# Patient Record
Sex: Female | Born: 1960
Health system: Southern US, Community
[De-identification: ages and names within clinical notes are randomized; demographics above are authoritative.]

## PROBLEM LIST (undated history)

## (undated) DIAGNOSIS — R7303 Prediabetes: Secondary | ICD-10-CM

## (undated) DIAGNOSIS — K219 Gastro-esophageal reflux disease without esophagitis: Secondary | ICD-10-CM

## (undated) DIAGNOSIS — G4730 Sleep apnea, unspecified: Secondary | ICD-10-CM

## (undated) HISTORY — DX: Gastro-esophageal reflux disease without esophagitis: K21.9

## (undated) HISTORY — DX: Sleep apnea, unspecified: G47.30

## (undated) SURGERY — Surgical Case
Anesthesia: *Unknown

---

## 2009-05-25 DIAGNOSIS — J302 Other seasonal allergic rhinitis: Secondary | ICD-10-CM | POA: Insufficient documentation

## 2009-05-25 DIAGNOSIS — G4733 Obstructive sleep apnea (adult) (pediatric): Secondary | ICD-10-CM | POA: Insufficient documentation

## 2013-02-15 ENCOUNTER — Encounter: Payer: Self-pay | Admitting: Certified Nurse Midwife

## 2013-02-17 ENCOUNTER — Encounter: Payer: Self-pay | Admitting: Certified Nurse Midwife

## 2013-02-17 ENCOUNTER — Ambulatory Visit (INDEPENDENT_AMBULATORY_CARE_PROVIDER_SITE_OTHER): Payer: 59 | Admitting: Certified Nurse Midwife

## 2013-02-17 VITALS — BP 112/80 | HR 72 | Resp 16 | Ht 61.75 in | Wt 228.0 lb

## 2013-02-17 DIAGNOSIS — Z0000 Encounter for general adult medical examination without abnormal findings: Secondary | ICD-10-CM

## 2013-02-17 DIAGNOSIS — Z01419 Encounter for gynecological examination (general) (routine) without abnormal findings: Secondary | ICD-10-CM

## 2013-02-17 DIAGNOSIS — N950 Postmenopausal bleeding: Secondary | ICD-10-CM

## 2013-02-17 NOTE — Patient Instructions (Signed)

## 2013-02-17 NOTE — Progress Notes (Signed)
52 y.o. [id number] Married Caucasian Fe here for annual exam. Menopausal since age 77 with no bleeding until 11/07 with 2 days of light bleeding with clots, no cramping or breast tenderness. Patient had previously had hot flashes in the past, none now. Denies vaginal discharge, itching or odor. No vaginal dryness or pain with sexual activity. Recent visit with Dr. Felipa Eth with all normal labs and exam. No health issues today  Patient's last menstrual period was 02/11/2013.          Sexually active: yes  The current method of family planning is vasectomy.    Exercising: no  exercise Smoker:  no  Health Maintenance: Pap:  02-17-12 neg HPV HR neg MMG:  11/13 Colonoscopy:  2012 polyp, 5 years BMD:   none TDaP:  2010 Labs: none Self breast exam:  Not done   reports that she has quit smoking. She does not have any smokeless tobacco history on file. She reports that she does not drink alcohol or use illicit drugs.  History reviewed. No pertinent past medical history.  History reviewed. No pertinent past surgical history.  No current outpatient prescriptions on file.   No current facility-administered medications for this visit.    Family History  Problem Relation Age of Onset  . Breast cancer Mother   . Cancer Mother     lung  . Cancer Father     pancreatic  . Heart disease Father   . Diabetes Father   . Cancer Maternal Grandmother   . Cancer Paternal Grandmother     pancreatic    ROS:  Pertinent items are noted in HPI.  Otherwise, a comprehensive ROS was negative.  Exam:   BP 112/80  Pulse 72  Resp 16  Ht 5' 1.75" (1.568 m)  Wt 228 lb (103.42 kg)  BMI 42.06 kg/m2  LMP 02/11/2013 Height: 5' 1.75" (156.8 cm)  Ht Readings from Last 3 Encounters:  02/17/13 5' 1.75" (1.568 m)    General appearance: alert, cooperative and appears stated age Head: Normocephalic, without obvious abnormality, atraumatic Neck: no adenopathy, supple, symmetrical, trachea midline and thyroid normal  to inspection and palpation Lungs: clear to auscultation bilaterally Breasts: normal appearance, no masses or tenderness, No nipple retraction or dimpling, No nipple discharge or bleeding, No axillary or supraclavicular adenopathy Heart: regular rate and rhythm Abdomen: soft, non-tender; no masses,  no organomegaly Extremities: extremities normal, atraumatic, no cyanosis or edema Skin: Skin color, texture, turgor normal. No rashes or lesions Lymph nodes: Cervical, supraclavicular, and axillary nodes normal. No abnormal inguinal nodes palpated Neurologic: Grossly normal   Pelvic: External genitalia:  no lesions              Urethra:  normal appearing urethra with no masses, tenderness or lesions              Bartholin's and Skene's: normal                 Vagina: normal appearing vagina with normal color and discharge, no lesions              Cervix: normal appearance, non tender              Pap taken: yes Bimanual Exam:  Uterus:  normal size, contour, position, consistency, mobility, non-tender and mid position              Adnexa: normal adnexa and no mass, fullness, tenderness  Rectovaginal: Confirms               Anus:  normal sphincter tone, no lesions  A:  Well Woman with normal exam  Menopausal with post menopausal bleeding  P:   Reviewed health and wellness pertinent to exam  Discussed need to evaluate vaginal bleeding with PUS and endometrial biopsy possibly SHG. Patient  Agreeable. Discussed ? Etiology polyp, fibroid. Goal is to rule out cancer. Patient aware. Will schedule if Dr Farrel Gobble agreeable. Patient to be notified. Patient to notify if bleeding again before evaluation.  Pap smear as per guidelines   Mammogram yearly pap smear taken today with reflex  counseled on breast self exam, mammography screening, adequate intake of calcium and vitamin D, diet and exercise  return annually or prn  An After Visit Summary was printed and given to the  patient.

## 2013-02-18 ENCOUNTER — Telehealth: Payer: Self-pay

## 2013-02-18 LAB — FOLLICLE STIMULATING HORMONE: FSH: 56.6 m[IU]/mL

## 2013-02-18 NOTE — Telephone Encounter (Signed)
lmtcb

## 2013-02-18 NOTE — Telephone Encounter (Signed)
Message copied by Eliezer Bottom on Fri Feb 18, 2013 12:36 PM ------      Message from: Catherine [name]      Created: Fri Feb 18, 2013 12:33 PM       Notify [facility] menopausal range      Will proceed with scheduling evaluation for post menopausal bleeding      Patient will be contacted to schedule ------

## 2013-02-21 ENCOUNTER — Other Ambulatory Visit: Payer: Self-pay | Admitting: Gynecology

## 2013-02-21 DIAGNOSIS — N950 Postmenopausal bleeding: Secondary | ICD-10-CM

## 2013-02-21 LAB — IPS PAP TEST WITH REFLEX TO HPV

## 2013-02-21 NOTE — Telephone Encounter (Signed)
Patient notified of results.

## 2013-02-21 NOTE — Telephone Encounter (Signed)
Left message for call back.

## 2013-02-22 ENCOUNTER — Telehealth: Payer: Self-pay | Admitting: Gynecology

## 2013-02-22 NOTE — Progress Notes (Signed)
Note reviewed, agree with plan.  Douglass Rivers, MD [facility] and EMB to be done

## 2013-02-22 NOTE — Telephone Encounter (Signed)
LMTCB on home number to discuss insurance benefits and to schedule SHGM.

## 2013-02-23 NOTE — Telephone Encounter (Signed)
LMTCB to discuss ins benefits and schedule SHGM

## 2013-03-08 ENCOUNTER — Ambulatory Visit (INDEPENDENT_AMBULATORY_CARE_PROVIDER_SITE_OTHER): Payer: 59 | Admitting: Gynecology

## 2013-03-08 ENCOUNTER — Ambulatory Visit (INDEPENDENT_AMBULATORY_CARE_PROVIDER_SITE_OTHER): Payer: 59

## 2013-03-08 ENCOUNTER — Other Ambulatory Visit: Payer: Self-pay | Admitting: Gynecology

## 2013-03-08 VITALS — BP 132/78 | Resp 18 | Ht 61.75 in | Wt 227.0 lb

## 2013-03-08 DIAGNOSIS — E669 Obesity, unspecified: Secondary | ICD-10-CM | POA: Insufficient documentation

## 2013-03-08 DIAGNOSIS — N950 Postmenopausal bleeding: Secondary | ICD-10-CM

## 2013-03-08 NOTE — Addendum Note (Signed)
Addended by: Douglass Rivers on: 03/08/2013 04:38 PM   Modules accepted: Orders

## 2013-03-08 NOTE — Progress Notes (Signed)
     Pt present today for u/s to evaluate post-menopausal bleeding.  Pt is 7y post-menopausal on no HRT.  Pt reports that the spotting has since stopped.  Pt is without complaints. U/s images were reviewed with pt, most notable for thickened lining of 7.7cm with some irregularity, adnexa c/w menopausal status. We discussed the impact of the thickened irregular lining and suggest a SHG and if no mass seen an EMB.  Risks and benefits were discussed, including bleeding, infection and uterine perforation.-accepted. Speculum placed, cervix visualized and cleaned with betadine, insemination catheter unable to pass, xylocaine jelly place, stabilized with single tooth, cervical os finder used to dilate canal, insemination catheter advanced and walls distended with ease.  Pt tolerated well. No gross defects were noted, we proceeded with EMB, uterus sounded to 9, 2 passes for both fluid and tissue, tolerated well tissue to pathology.  Will contact with results. 11m spent reviewing u/s findings and need for further evaluation testing, >50% face to face

## 2013-03-11 LAB — IPS CERVICAL/ECC/EMB/VULVAR/VAGINAL BIOPSY

## 2013-03-14 ENCOUNTER — Other Ambulatory Visit: Payer: Self-pay | Admitting: Orthopedic Surgery

## 2013-03-17 ENCOUNTER — Telehealth: Payer: Self-pay | Admitting: *Deleted

## 2013-03-17 MED ORDER — MEDROXYPROGESTERONE ACETATE 10 MG PO TABS: 10.0000 mg | ORAL_TABLET | Freq: Every day | ORAL | Status: DC

## 2013-03-17 NOTE — Telephone Encounter (Signed)
Message copied by Alisa Graff on Thu Mar 17, 2013 11:38 AM ------      Message from: Douglass Rivers      Created: Fri Mar 11, 2013  1:08 PM       Inform that biopsy was disordered proliferative not cancer, would like to treat with provera 10mg  for 10d for 29m, ov in 42m for follow up ------

## 2013-03-21 ENCOUNTER — Other Ambulatory Visit: Payer: Self-pay | Admitting: Orthopedic Surgery

## 2013-04-07 HISTORY — PX: ENDOMETRIAL BIOPSY: SHX622

## 2013-04-25 NOTE — Telephone Encounter (Signed)
See result note, patient notified and reviewed instructions on 03-17-13.  Routing to provider for final review. Patient agreeable to disposition. Will close encounter

## 2013-04-25 NOTE — Telephone Encounter (Signed)
Spoke to patient on 03-17-13 and 3 month follow up scheduled for 06-13-13.

## 2013-06-13 ENCOUNTER — Encounter: Payer: Self-pay | Admitting: Gynecology

## 2013-06-13 ENCOUNTER — Ambulatory Visit (INDEPENDENT_AMBULATORY_CARE_PROVIDER_SITE_OTHER): Payer: 59 | Admitting: Gynecology

## 2013-06-13 VITALS — BP 135/97 | HR 76 | Resp 18 | Ht 61.75 in | Wt 222.0 lb

## 2013-06-13 DIAGNOSIS — N950 Postmenopausal bleeding: Secondary | ICD-10-CM

## 2013-06-13 NOTE — Progress Notes (Signed)
Subjective:     Patient ID: Catherine [name], female   DOB: [birth year], 53 y.o.   MRN: [id number]  HPI Comments: Pt here for f/u.  She was treated with provera 10mg x10d for 37m for disordered proliferative endometrium on emb done for pmb.  Pt reports that the first cycle was very heavy, changing every 2h for 5d.  The next 2 cycles were lighter, changing twice a day for 3d.  No bleeding between the scheduled bleeds.  No postcoital bleeding. Overall feel well.    Review of Systems Per HPI     Objective:   Physical Exam  Nursing note and vitals reviewed. Constitutional: She is oriented to person, place, and time. She appears well-developed and well-nourished.  Neurological: She is alert and oriented to person, place, and time.   Pelvic: External genitalia:  no lesions              Urethra:  normal appearing urethra with no masses, tenderness or lesions              Bartholins and Skenes: normal                 Vagina: normal appearing vagina with normal color and discharge, no lesions              Cervix: normal appearance, min blood noted                   Bimanual Exam:  Uterus:  uterus is normal size, shape, consistency and nontender                                      Adnexa: normal adnexa in size, nontender and no masses                                          Assessment:     PMB    Plan:     Pt continued to bleed although better after 3rd provera treatment, recommend repeating biopsy, pt agreeable.  Consent obtained  Speculum placed, cervix cleansed with betadine x3, xylocaine jelly placed sigle tooth placed at 12..piplle advanced to 8cm.  Moderate tissue obtained on single pass. No bleeding from tenaculum site. Tissue to path.  Pt tolerated well. Will contact with results.

## 2013-06-16 LAB — IPS CERVICAL/ECC/EMB/VULVAR/VAGINAL BIOPSY

## 2013-06-20 ENCOUNTER — Telehealth: Payer: Self-pay | Admitting: Gynecology

## 2013-06-20 NOTE — Telephone Encounter (Signed)
Pt informed of biopsy results.  Prior biopsy disordered proliferative endometrium, now after 16m of progestin her biopsy shows complex hyperplasia on background of simple, no atypia.  Pathology explained to pt.  I recommend proceeding with D&C hysteroscopy, procedure briefly reviewed with pt. Questions addressed. Will send for precert.

## 2013-06-27 ENCOUNTER — Telehealth: Payer: Self-pay | Admitting: *Deleted

## 2013-06-27 NOTE — Telephone Encounter (Signed)
Message copied by Jaymes Graff on Mon Jun 27, 2013  6:12 PM ------      Message from: Elveria Rising      Created: Mon Jun 20, 2013  3:59 PM       Pt infromed see telephone enc ------

## 2013-06-27 NOTE — Telephone Encounter (Signed)
Call to patient, Phone number on ROI, VM confirms "[facility]".  LMTCB, no emergency.  Calling to check on date options for surgery.

## 2013-06-28 NOTE — Telephone Encounter (Signed)
Spoke with patient. Advised that she will have 0 liability for the surgeons portion of upcoming surgery.  Advised patient that she would be hearing from [place] to schedule surgery. Patient has no date preferences, but prefers afternoon surgery.

## 2013-06-28 NOTE — Telephone Encounter (Signed)
Returning a call to [place]. Please call work number.

## 2013-06-28 NOTE — Telephone Encounter (Signed)
Call back to patient at work number, [employer] [facility] [facility]. LM I will try her again in am.

## 2013-06-28 NOTE — Telephone Encounter (Signed)
Call back to patient, LMTCB, VM confirms "Catherine [name]".  Need to check on time restriction for surgery since we would not generally schedule an afternoon case.

## 2013-06-29 NOTE — Telephone Encounter (Signed)
Pt returning call

## 2013-06-29 NOTE — Telephone Encounter (Signed)
Returning a call to Catherine [name]. °

## 2013-06-29 NOTE — Telephone Encounter (Signed)
Call to patient cell number, LMTCB.  Can speak to [place] or [place]. No emergency.

## 2013-06-29 NOTE — Telephone Encounter (Signed)
LMTCB on cell and work numbers.  Has name confirmation on both numbers, no details left.  Patient requested an afternoon surgery time.  I need to know what time she is anticipating this.  We would not normally schedule an afternoon surgery and will be limited availability at hospital.

## 2013-06-30 NOTE — Telephone Encounter (Signed)
Patient called in.  States she can be flexible, no real date preference.  She thought Dr Charlies Constable said we offered late evening options, discussed that this is likely referring to our late office hours on Monday and Wednesday till 7 in the office.

## 2013-07-11 NOTE — Telephone Encounter (Signed)
Return call to patient, LMTCB.  

## 2013-07-11 NOTE — Telephone Encounter (Signed)
Patient calling to speak with Sally. °

## 2013-07-12 ENCOUNTER — Encounter (HOSPITAL_COMMUNITY): Payer: Self-pay | Admitting: Pharmacist

## 2013-07-13 NOTE — Telephone Encounter (Signed)
Advised of surgery date of 07-27-13 at 1100 at [facility]. Patient really prefers 08-03-13 or into May. Reviewed with Dr Charlies Constable, really dont recommend waiting into May and 08-03-13 is unavail at hosp.  Patient agrees to 07-27-13 ddate. Surgery instructions reviewed, will mail instruction sheet. Pre/post op appointments scheduled. Call PRN.  Routing to provider for final review. Patient agreeable to disposition. Will close encounter

## 2013-07-18 ENCOUNTER — Ambulatory Visit (INDEPENDENT_AMBULATORY_CARE_PROVIDER_SITE_OTHER): Payer: 59 | Admitting: Gynecology

## 2013-07-18 ENCOUNTER — Encounter: Payer: Self-pay | Admitting: Gynecology

## 2013-07-18 VITALS — BP 120/78 | Resp 18 | Ht 61.75 in | Wt 228.0 lb

## 2013-07-18 DIAGNOSIS — N8501 Benign endometrial hyperplasia: Secondary | ICD-10-CM

## 2013-07-18 NOTE — Progress Notes (Signed)
  52 y.o. Married Not Hispanic or Latino female presents for preoperative consult for D&C hysteroscopy with tru-clear for post menopausal bleeding.  Pt had biopsy which showed disordered proliferative endometrium treated with 3m of provera, follow up biopsy showed complex hyperplasia on background of simple, neither had atypia.   Pertinent items are noted in HPI.  BP 120/78  Resp 18  Ht 5' 1.75" (1.568 m)  Wt 228 lb (103.42 kg)  BMI 42.06 kg/m2  LMP 02/11/2013 General appearance: alert, cooperative and appears stated age Lungs: clear to auscultation bilaterally Heart: regular rate and rhythm, S1, S2 normal, no murmur, click, rub or gallop Abdomen: soft, non-tender; bowel sounds normal; no masses,  no organomegaly Pelvic: cervix normal in appearance, external genitalia normal, no adnexal masses or tenderness, no cervical motion tenderness, rectovaginal septum normal, uterus normal size, shape, and consistency and vagina normal without discharge  The procedure was discussed at length  Pt informed regarding risks and benefits of surgery, including but not limited to bleeding, infections, damage to bowel or bladder due to uterine perforation either during the procedure or during dilation. Possible laparoscopy of perforation should occur.  cytotecwill be be given preoperatively to soften the cervix.    There is a risk of formation of a deep vein thrombus in the extremities was discussed, although PAS will be placed to minimize the risk, the patient was informed that a pulmonary embolism could still form and could result in death.  She was instructed on signs and symptoms to be aware of and the need to call if they should develop.  Fluid overload from the distending media was discussed as was the usual intra-operative safety precautions in place.  All questions were addressed.  Post-operative medications NSAID were not given to the patient.  30m spent counseling for surgery and reviewing biopsy  results, >50% face to face  

## 2013-07-27 ENCOUNTER — Encounter (HOSPITAL_COMMUNITY)
Admission: RE | Disposition: A | Discharge status: Home / Self Care | Payer: Self-pay | Source: Ambulatory Visit | Attending: Gynecology

## 2013-07-27 ENCOUNTER — Encounter (HOSPITAL_COMMUNITY): Payer: 59 | Admitting: Anesthesiology

## 2013-07-27 ENCOUNTER — Encounter (HOSPITAL_COMMUNITY): Payer: Self-pay | Admitting: *Deleted

## 2013-07-27 ENCOUNTER — Ambulatory Visit (HOSPITAL_COMMUNITY): Payer: 59 | Admitting: Anesthesiology

## 2013-07-27 ENCOUNTER — Ambulatory Visit (HOSPITAL_COMMUNITY)
Admission: RE | Admit: 2013-07-27 | Discharge: 2013-07-27 | Disposition: A | Discharge status: Home / Self Care | Payer: 59 | Source: Ambulatory Visit | Attending: Gynecology | Admitting: Gynecology

## 2013-07-27 DIAGNOSIS — Z87891 Personal history of nicotine dependence: Secondary | ICD-10-CM | POA: Insufficient documentation

## 2013-07-27 DIAGNOSIS — Z9889 Other specified postprocedural states: Secondary | ICD-10-CM

## 2013-07-27 DIAGNOSIS — N840 Polyp of corpus uteri: Secondary | ICD-10-CM | POA: Insufficient documentation

## 2013-07-27 DIAGNOSIS — K219 Gastro-esophageal reflux disease without esophagitis: Secondary | ICD-10-CM | POA: Insufficient documentation

## 2013-07-27 DIAGNOSIS — N8501 Benign endometrial hyperplasia: Secondary | ICD-10-CM | POA: Insufficient documentation

## 2013-07-27 DIAGNOSIS — Z6841 Body Mass Index (BMI) 40.0 and over, adult: Secondary | ICD-10-CM | POA: Insufficient documentation

## 2013-07-27 DIAGNOSIS — N950 Postmenopausal bleeding: Secondary | ICD-10-CM

## 2013-07-27 HISTORY — PX: DILATATION & CURETTAGE/HYSTEROSCOPY WITH TRUECLEAR: SHX6353

## 2013-07-27 LAB — CBC
HEMATOCRIT: 42.1 % (ref 36.0–46.0)
HEMOGLOBIN: 14 g/dL (ref 12.0–15.0)
MCH: 28.2 pg (ref 26.0–34.0)
MCHC: 33.3 g/dL (ref 30.0–36.0)
MCV: 84.9 fL (ref 78.0–100.0)
Platelets: 234 10*3/uL (ref 150–400)
RBC: 4.96 MIL/uL (ref 3.87–5.11)
RDW: 14.3 % (ref 11.5–15.5)
WBC: 9 10*3/uL (ref 4.0–10.5)

## 2013-07-27 SURGERY — DILATATION & CURETTAGE/HYSTEROSCOPY WITH TRUCLEAR
Anesthesia: General | Site: Vagina

## 2013-07-27 MED ORDER — KETOROLAC TROMETHAMINE 30 MG/ML IJ SOLN
INTRAMUSCULAR | Status: DC | PRN
  Administered 2013-07-27: 30 mg via INTRAVENOUS

## 2013-07-27 MED ORDER — FENTANYL CITRATE 0.05 MG/ML IJ SOLN
INTRAMUSCULAR | Status: DC | PRN
  Administered 2013-07-27 (×2): 50 ug via INTRAVENOUS

## 2013-07-27 MED ORDER — FENTANYL CITRATE 0.05 MG/ML IJ SOLN
INTRAMUSCULAR | Status: AC
Start: 2013-07-27 — End: 2013-07-27
  Filled 2013-07-27: qty 5

## 2013-07-27 MED ORDER — KETOROLAC TROMETHAMINE 30 MG/ML IJ SOLN
INTRAMUSCULAR | Status: AC
  Filled 2013-07-27: qty 1

## 2013-07-27 MED ORDER — LACTATED RINGERS IV SOLN
INTRAVENOUS | Status: DC
  Administered 2013-07-27 (×2): via INTRAVENOUS

## 2013-07-27 MED ORDER — SODIUM CHLORIDE 0.9 % IR SOLN
Status: DC | PRN
  Administered 2013-07-27: 3000 mL

## 2013-07-27 MED ORDER — ONDANSETRON HCL 4 MG/2ML IJ SOLN: 4.0000 mg | Freq: Once | INTRAMUSCULAR | Status: DC | PRN

## 2013-07-27 MED ORDER — KETOROLAC TROMETHAMINE 30 MG/ML IJ SOLN: 15.0000 mg | Freq: Once | INTRAMUSCULAR | Status: DC | PRN

## 2013-07-27 MED ORDER — BUPIVACAINE-EPINEPHRINE 0.25% -1:200000 IJ SOLN
INTRAMUSCULAR | Status: DC | PRN
  Administered 2013-07-27: 17 mL

## 2013-07-27 MED ORDER — LIDOCAINE HCL (CARDIAC) 20 MG/ML IV SOLN
INTRAVENOUS | Status: DC | PRN
  Administered 2013-07-27: 80 mg via INTRAVENOUS

## 2013-07-27 MED ORDER — LIDOCAINE HCL 2 % IJ SOLN
INTRAMUSCULAR | Status: DC | PRN
  Administered 2013-07-27: 17 mL

## 2013-07-27 MED ORDER — LIDOCAINE HCL 2 % IJ SOLN
INTRAMUSCULAR | Status: AC
  Filled 2013-07-27: qty 20

## 2013-07-27 MED ORDER — LACTATED RINGERS IV SOLN: INTRAVENOUS | Status: DC

## 2013-07-27 MED ORDER — ONDANSETRON HCL 4 MG/2ML IJ SOLN
INTRAMUSCULAR | Status: AC
  Filled 2013-07-27: qty 2

## 2013-07-27 MED ORDER — FENTANYL CITRATE 0.05 MG/ML IJ SOLN: 25.0000 ug | INTRAMUSCULAR | Status: DC | PRN

## 2013-07-27 MED ORDER — DEXAMETHASONE SODIUM PHOSPHATE 4 MG/ML IJ SOLN
INTRAMUSCULAR | Status: DC | PRN
Start: 2013-07-27 — End: 2013-07-27
  Administered 2013-07-27: 8 mg via INTRAVENOUS

## 2013-07-27 MED ORDER — MEPERIDINE HCL 25 MG/ML IJ SOLN: 6.2500 mg | INTRAMUSCULAR | Status: DC | PRN

## 2013-07-27 MED ORDER — BUPIVACAINE-EPINEPHRINE PF 0.25-1:200000 % IJ SOLN
INTRAMUSCULAR | Status: AC
  Filled 2013-07-27: qty 30

## 2013-07-27 MED ORDER — PROPOFOL 10 MG/ML IV BOLUS
INTRAVENOUS | Status: DC | PRN
  Administered 2013-07-27: 200 mg via INTRAVENOUS
  Administered 2013-07-27: 50 mg via INTRAVENOUS

## 2013-07-27 MED ORDER — MIDAZOLAM HCL 2 MG/2ML IJ SOLN
INTRAMUSCULAR | Status: DC | PRN
  Administered 2013-07-27: 2 mg via INTRAVENOUS

## 2013-07-27 MED ORDER — LIDOCAINE HCL (CARDIAC) 20 MG/ML IV SOLN
INTRAVENOUS | Status: AC
  Filled 2013-07-27: qty 5

## 2013-07-27 MED ORDER — PROPOFOL 10 MG/ML IV EMUL
INTRAVENOUS | Status: AC
  Filled 2013-07-27: qty 20

## 2013-07-27 MED ORDER — MIDAZOLAM HCL 2 MG/2ML IJ SOLN
INTRAMUSCULAR | Status: AC
  Filled 2013-07-27: qty 2

## 2013-07-27 MED ORDER — DEXAMETHASONE SODIUM PHOSPHATE 10 MG/ML IJ SOLN
INTRAMUSCULAR | Status: AC
  Filled 2013-07-27: qty 1

## 2013-07-27 SURGICAL SUPPLY — 24 items
BLADE INCISOR TRUC PLUS 2.9 (ABLATOR) ×2 IMPLANT
CANISTERS HI-FLOW 3000CC (CANNISTER) ×12 IMPLANT
CATH ROBINSON RED A/P 16FR (CATHETERS) ×4 IMPLANT
CONTAINER PREFILL 10% NBF 60ML (FORM) ×8 IMPLANT
DRAPE HYSTEROSCOPY (DRAPE) ×4 IMPLANT
DRSG TELFA 3X8 NADH (GAUZE/BANDAGES/DRESSINGS) ×4 IMPLANT
ELECT REM PT RETURN 9FT ADLT (ELECTROSURGICAL)
ELECTRODE REM PT RTRN 9FT ADLT (ELECTROSURGICAL) IMPLANT
GLOVE BIOGEL M 6.5 STRL (GLOVE) ×4 IMPLANT
GLOVE BIOGEL PI IND STRL 6.5 (GLOVE) ×2 IMPLANT
GLOVE BIOGEL PI INDICATOR 6.5 (GLOVE) ×2
GOWN STRL REUS W/TWL LRG LVL3 (GOWN DISPOSABLE) ×8 IMPLANT
INCISOR TRUC PLUS BLADE 2.9 (ABLATOR) ×4
KIT HYSTEROSCOPY TRUCLEAR (ABLATOR) ×4 IMPLANT
LOOP ANGLED CUTTING 22FR (CUTTING LOOP) IMPLANT
NEEDLE HYPO 21X1.5 SAFETY (NEEDLE) IMPLANT
NEEDLE SPNL 22GX3.5 QUINCKE BK (NEEDLE) ×4 IMPLANT
PACK VAGINAL MINOR WOMEN LF (CUSTOM PROCEDURE TRAY) ×4 IMPLANT
PAD OB MATERNITY 4.3X12.25 (PERSONAL CARE ITEMS) ×4 IMPLANT
SET TUBING HYSTEROSCOPY 2 NDL (TUBING) IMPLANT
SYR CONTROL 10ML LL (SYRINGE) ×4 IMPLANT
TOWEL OR 17X24 6PK STRL BLUE (TOWEL DISPOSABLE) ×8 IMPLANT
TUBE HYSTEROSCOPY W Y-CONNECT (TUBING) IMPLANT
WATER STERILE IRR 1000ML POUR (IV SOLUTION) ×4 IMPLANT

## 2013-07-27 NOTE — Anesthesia Postprocedure Evaluation (Signed)
Anesthesia Post Note  Patient: Catherine [name]  Procedure(s) Performed: Procedure(s) (LRB): DILATATION & CURETTAGE/HYSTEROSCOPY WITH TRUCLEAR (N/A)  Anesthesia type: General  Patient location: PACU  Post pain: Pain level controlled  Post assessment: Post-op Vital signs reviewed  Last Vitals:  Filed Vitals:   07/27/13 1230  BP: 130/71  Pulse: 83  Temp:   Resp: 16    Post vital signs: Reviewed  Level of consciousness: sedated  Complications: No apparent anesthesia complications

## 2013-07-27 NOTE — Interval H&P Note (Signed)
History and Physical Interval Note:  07/27/2013 10:52 AM  Catherine [name]  has presented today for surgery, with the diagnosis of complex hyperplasia  The various methods of treatment have been discussed with the patient and family. After consideration of risks, benefits and other options for treatment, the patient has consented to  Procedure(s): DILATATION AND CURETTAGE /HYSTEROSCOPY (N/A) as a surgical intervention .  The patient's history has been reviewed, patient examined, no change in status, stable for surgery.  I have reviewed the patient's chart and labs.  Questions were answered to the patient's satisfaction.     Azalia Bilis

## 2013-07-27 NOTE — Brief Op Note (Signed)
07/27/2013  11:53 AM  PATIENT:  Catherine [name]  53 y.o. female  PRE-OPERATIVE DIAGNOSIS:  complex hyperplasia  POST-OPERATIVE DIAGNOSIS:  complex hyperplasia  PROCEDURE:  Procedure(s): DILATATION AND CURETTAGE /HYSTEROSCOPY (N/A) DILATATION & CURETTAGE/HYSTEROSCOPY WITH TRUCLEAR (N/A)  SURGEON:  Surgeon(s) and Role:    * Azalia Bilis, MD - Primary  PHYSICIAN ASSISTANT:   ASSISTANTS: none   ANESTHESIA:   general  EBL:  Total I/O In: 1000 [I.V.:1000] Out: -   BLOOD ADMINISTERED:none  DRAINS: none   LOCAL MEDICATIONS USED:  0.25% MARCAINE   , 2%LIDOCAINE  and Amount: 17 ml  SPECIMEN:  Source of Specimen:  uterus  DISPOSITION OF SPECIMEN:  PATHOLOGY  COUNTS:  YES  TOURNIQUET:  * No tourniquets in log *  DICTATION: .Other Dictation: Dictation Number [id number]  PLAN OF CARE: Discharge to home after PACU  PATIENT DISPOSITION:  PACU - hemodynamically stable.   Delay start of Pharmacological VTE agent (>24hrs) due to surgical blood loss or risk of bleeding: not applicable

## 2013-07-27 NOTE — Anesthesia Preprocedure Evaluation (Signed)
Anesthesia Evaluation  Patient identified by MRN, date of birth, ID band Patient awake    Reviewed: Allergy & Precautions, H&P , NPO status , Patient's Chart, lab work & pertinent test results  Airway Mallampati: II TM Distance: >3 FB Neck ROM: full    Dental no notable dental hx. (+) Teeth Intact   Pulmonary former smoker,    Pulmonary exam normal       Cardiovascular negative cardio ROS      Neuro/Psych negative neurological ROS  negative psych ROS   GI/Hepatic Neg liver ROS, GERD-  Medicated and Controlled,  Endo/Other  negative endocrine ROSMorbid obesityOSA but does not use CPAP.  Renal/GU negative Renal ROS     Musculoskeletal   Abdominal (+) + obese,   Peds  Hematology negative hematology ROS (+)   Anesthesia Other Findings   Reproductive/Obstetrics negative OB ROS                           Anesthesia Physical Anesthesia Plan  ASA: III  Anesthesia Plan: General   Post-op Pain Management:    Induction: Intravenous  Airway Management Planned: LMA  Additional Equipment:   Intra-op Plan:   Post-operative Plan:   Informed Consent: I have reviewed the patients History and Physical, chart, labs and discussed the procedure including the risks, benefits and alternatives for the proposed anesthesia with the patient or authorized representative who has indicated his/her understanding and acceptance.     Plan Discussed with: CRNA and Surgeon  Anesthesia Plan Comments: (Proseal)        Anesthesia Quick Evaluation

## 2013-07-27 NOTE — Op Note (Signed)
NAME:  Catherine [name], Catherine [name] NO.:  [id number]  MEDICAL RECORD NO.:  [id number]  LOCATION:  [facility]                          FACILITY:  [facility]  PHYSICIAN:  Alver Sorrow. Charlies Constable, M.D. DATE OF BIRTH:  Jul 30, 1960  DATE OF PROCEDURE:  07/27/2013 DATE OF DISCHARGE:  07/27/2013                              OPERATIVE REPORT   PREOPERATIVE DIAGNOSES: 1. Complex hyperplasia without atypia. 2. Postmenopausal bleeding.  POSTOPERATIVE DIAGNOSES: 1. Complex hyperplasia without atypia. 2. Postmenopausal bleeding. 3. Endometrial polyp.  SURGEON:  Alver Sorrow. Charlies Constable, M.D.  ANESTHESIA:  General.  IV FLUID:  1 L of lactated Ringer's.  URINE OUTPUT:  300 mL.  ESTIMATED BLOOD LOSS:  Minimal.  I and O deficit, 90 mL.  INDICATIONS:  The patient is a 53 year old postmenopausal woman, who had an episode of postmenopausal bleeding.  She had an endometrial biopsy that showed disordered proliferative endometrium, and was treated with 3 months of cyclic Provera.  Her follow up biopsy showed complex hyperplasia on a background of simple hyperplasia without atypia, and now presents for a D and C and hysteroscopy.  FINDINGS:  The uterus sounded to 9.  There was a polypoid mass in the right ostial area consistent with a polyp.  The lining was otherwise unremarkable.  Moderate amount of tissue was obtained on sampling.  The cavity was without fibroids.    Pathology: uterine contents.  DESCRIPTION OF PROCEDURE:  The patient was taken to the operating room. General anesthesia was induced.  Placed in the dorsal lithotomy position.  Prepped and draped in usual sterile fashion.  Bimanual exam was performed for the orientation of the uterus.  The bivalve speculum was then placed in the vagina.  The cervix was stabilized with a single- tooth tenaculum and a paracervical block was performed circumferentially around the cervix for a total of 17 mL of equal parts, 2% lidocaine, 0.25% Marcaine.  Sterile  weighted speculum was then placed in the vagina.  The uterus sounded to 9.  The cervix was gently dilated up to 23-French.  The TruClear hysteroscope, however, failed to pass into the cavity.  The speculum was removed and the bivalve was replaced.  The cervix was dilated then to 25-French.  With the new speculum, the hysteroscope advanced with ease into the cavity and she was slightly over dilated.  The findings were as mentioned above.  The TruClear device was then set up appropriately, window locked, and advanced through the hysteroscope and into the cavity.  Under direct visualization, the polypoid mass was resected.  The TruClear was also used to take sampling of the anterior and posterior wall with good retention of tissue.  The hysteroscope was then removed.  Sharp curettage was performed for minimal additional tissue.  The hysteroscope was then advanced back through the cavity, which showed unremarkable changes.  The patient tolerated the procedure well.  Sponge, lap, and needle counts correct x2.  She was extubated in the OR and transferred to the recovery room in stable condition.     Alver Sorrow. Charlies Constable, M.D.     THL/MEDQ  D:  07/27/2013  T:  07/27/2013  Job:  [id number]

## 2013-07-27 NOTE — Discharge Instructions (Signed)

## 2013-07-27 NOTE — H&P (View-Only) (Signed)
  53 y.o. Married Not Hispanic or Latino female presents for preoperative consult for [facility] hysteroscopy with tru-clear for post menopausal bleeding.  Pt had biopsy which showed disordered proliferative endometrium treated with 40m of provera, follow up biopsy showed complex hyperplasia on background of simple, neither had atypia.   Pertinent items are noted in HPI.  BP 120/78  Resp 18  Ht 5' 1.75" (1.568 m)  Wt 228 lb (103.42 kg)  BMI 42.06 kg/m2  LMP 02/11/2013 General appearance: alert, cooperative and appears stated age Lungs: clear to auscultation bilaterally Heart: regular rate and rhythm, S1, S2 normal, no murmur, click, rub or gallop Abdomen: soft, non-tender; bowel sounds normal; no masses,  no organomegaly Pelvic: cervix normal in appearance, external genitalia normal, no adnexal masses or tenderness, no cervical motion tenderness, rectovaginal septum normal, uterus normal size, shape, and consistency and vagina normal without discharge  The procedure was discussed at length  Pt informed regarding risks and benefits of surgery, including but not limited to bleeding, infections, damage to bowel or bladder due to uterine perforation either during the procedure or during dilation. Possible laparoscopy of perforation should occur.  cytotecwill be be given preoperatively to soften the cervix.    There is a risk of formation of a deep vein thrombus in the extremities was discussed, although PAS will be placed to minimize the risk, the patient was informed that a pulmonary embolism could still form and could result in death.  She was instructed on signs and symptoms to be aware of and the need to call if they should develop.  Fluid overload from the distending media was discussed as was the usual intra-operative safety precautions in place.  All questions were addressed.  Post-operative medications NSAID were not given to the patient.  5m spent counseling for surgery and reviewing biopsy  results, >50% face to face

## 2013-07-27 NOTE — Transfer of Care (Signed)
Immediate Anesthesia Transfer of Care Note  Patient: Catherine [name]  Procedure(s) Performed: Procedure(s): DILATATION & CURETTAGE/HYSTEROSCOPY WITH TRUCLEAR (N/A)  Patient Location: PACU  Anesthesia Type:General  Level of Consciousness: awake, alert  and oriented  Airway & Oxygen Therapy: Patient Spontanous Breathing and Patient connected to nasal cannula oxygen  Post-op Assessment: Report given to PACU RN and Post -op Vital signs reviewed and stable  Post vital signs: Reviewed and stable  Complications: No apparent anesthesia complications

## 2013-07-28 ENCOUNTER — Encounter (HOSPITAL_COMMUNITY): Payer: Self-pay | Admitting: Gynecology

## 2013-08-03 ENCOUNTER — Telehealth: Payer: Self-pay | Admitting: *Deleted

## 2013-08-03 NOTE — Telephone Encounter (Signed)
Per Dr lathrop instruction, patient notified of benign surgery path report.  Dr Charlies Constable recommends having initial endo biopsy specimen that was read at [facility] be sent to [facility] for comparrison.  Patient is agreeable and aware we should receive final report beginnning of week.  She has post op on 08-10-13.    Routing to provider for final review. Patient agreeable to disposition. Will close encounter

## 2013-08-03 NOTE — Telephone Encounter (Signed)
See next phone message

## 2013-08-03 NOTE — Telephone Encounter (Signed)
Message copied by Jaymes Graff on Wed Aug 03, 2013  6:53 PM ------      Message from: Elveria Rising      Created: Fri Jul 29, 2013  9:38 AM       Pt had D&C Hysteroscopy for complex hyperplasia without atypia after treated with progestin.  Her pathology is now disordered proliferative?  Can we have [facility] look at both of her EMB's that were done before the OR?      Thanks.      TL ------

## 2013-08-09 ENCOUNTER — Telehealth: Payer: Self-pay | Admitting: *Deleted

## 2013-08-09 NOTE — Telephone Encounter (Signed)
Pathology consult from [facility] received. Reviewed by Dr Charlies Constable.  Per Dr Charlies Constable, notified patient that GPA confirms original diagnosis on endo biopsy.  Also advised that current pathology negative and Dr Charlies Constable will plan to follow up with endo biopsy in 3 months.  Will discuss in detail at post op visit tomorrow.  Routing to provider for final review. Patient agreeable to disposition. Will close encounter

## 2013-08-10 ENCOUNTER — Ambulatory Visit (INDEPENDENT_AMBULATORY_CARE_PROVIDER_SITE_OTHER): Payer: 59 | Admitting: Gynecology

## 2013-08-10 ENCOUNTER — Encounter: Payer: Self-pay | Admitting: Gynecology

## 2013-08-10 VITALS — BP 130/76 | HR 84 | Resp 16 | Ht 61.75 in | Wt 231.0 lb

## 2013-08-10 DIAGNOSIS — N8501 Benign endometrial hyperplasia: Secondary | ICD-10-CM

## 2013-08-10 DIAGNOSIS — N950 Postmenopausal bleeding: Secondary | ICD-10-CM

## 2013-08-10 NOTE — Progress Notes (Signed)
Subjective:     Patient ID: Catherine [name], female   DOB: 1961/01/25, 53 y.o.   MRN: [id number]  HPI Comments: Pt here 2w post-op after D&C Hysteroscopy for PMB, complex hyperplasia without atypia.  Pt states that she did well afdter surgery and had only spotting.      Review of Systems  Constitutional: Negative for fever and chills.  Genitourinary: Negative for vaginal discharge and pelvic pain.       Objective:   Physical Exam  Nursing note and vitals reviewed. Constitutional: She is oriented to person, place, and time. She appears well-developed.  Neurological: She is alert and oriented to person, place, and time.   Pelvic: External genitalia:  no lesions              Urethra:  normal appearing urethra with no masses, tenderness or lesions              Bartholins and Skenes: normal                 Vagina: normal appearing vagina with normal color and discharge, no lesions              Cervix: normal appearance                     Bimanual Exam:  Uterus:  uterus is normal size, shape, consistency and nontender                                      Adnexa: normal adnexa in size, nontender and no masses                                         Assessment:     Post-op endometrial polyp     Plan:     Operative photos reviewed as well as re-read of pathology which concurred with initial reading. Minute fragment of complex on emb Repeat biopsy in 26m, pt agreeable

## 2013-08-12 ENCOUNTER — Telehealth: Payer: Self-pay | Admitting: Gynecology

## 2013-08-12 NOTE — Telephone Encounter (Signed)
Spoke with patient. Advised that per benefit quote received, she will be responsible for a $17 copay for Endo BX. Patient agreeable. Scheduled for 3 months out.  Mailed the In-Office procedure form that includes appointment date and time, patient copay, and cancellation policy.

## 2013-10-10 ENCOUNTER — Telehealth: Payer: Self-pay | Admitting: Gynecology

## 2013-10-10 NOTE — Telephone Encounter (Signed)
Left message for patient to call back. Need to rescheduled procedure due to time. Dr Charlies Constable is no longer accepting late pm appts.

## 2013-10-11 NOTE — Telephone Encounter (Signed)
Pt is returning a call to [place]

## 2013-10-11 NOTE — Telephone Encounter (Signed)
Spoke with patient. Rescheduled appt

## 2013-11-03 ENCOUNTER — Telehealth: Payer: Self-pay | Admitting: Gynecology

## 2013-11-03 NOTE — Telephone Encounter (Signed)
Patient cancelled her endo bx appointment for 11/14/13 with Dr. Charlies Constable. She is starting a new job that week and is not sure when she "will be able to be seen after that." Patient hopes to move the appointment up to sometime next week.

## 2013-11-03 NOTE — Telephone Encounter (Signed)
Spoke with patient. Appointment rescheduled for August 5th at 4pm with Dr.Lathrop. Patient agreeable to date and time.  Routing to provider for final review. Patient agreeable to disposition. Will close encounter

## 2013-11-09 ENCOUNTER — Ambulatory Visit (INDEPENDENT_AMBULATORY_CARE_PROVIDER_SITE_OTHER): Payer: 59 | Admitting: Gynecology

## 2013-11-09 ENCOUNTER — Encounter: Payer: Self-pay | Admitting: Gynecology

## 2013-11-09 VITALS — BP 134/82 | Resp 18 | Ht 61.75 in | Wt 224.0 lb

## 2013-11-09 DIAGNOSIS — N8501 Benign endometrial hyperplasia: Secondary | ICD-10-CM

## 2013-11-09 NOTE — Progress Notes (Signed)
Pt here for repeat endometrial biopsy after complex hyperplasia without atypia on EMB .  Pt has been treated with progestin for disordered endometrium and repeat bx with complex. Surgical pathology had no evidence of hyperplasia, reread of initial EMB showed small foci only of complex.  Pt has had no bleeding since last visit.  Consent obtained, risks and benefits reviewed and accepted.  BME: Uterus anteverted, mobile nontender. Adnexa-negative  Procedure: Speculum placed, cervix cleansed with betadine, xylocaine jelly placed. Single tooth tenaculum placed anterior lip. pipelle advanced to 9cm.  Tissue and blood obtained on 3x passes.  Tolerated well Tissue to pathology BME afterwards, uterus non-tender  Will triage based on results

## 2013-11-11 ENCOUNTER — Telehealth: Payer: Self-pay

## 2013-11-11 NOTE — Telephone Encounter (Signed)
Spoke with patient. Results given as seen below. Patient agreeable and verbalizes understanding.  Routing to provider for final review. Patient agreeable to disposition. Will close encounter

## 2013-11-11 NOTE — Telephone Encounter (Signed)
Message copied by Jasmine Awe on Fri Nov 11, 2013  3:43 PM ------      Message from: Elveria Rising      Created: Fri Nov 11, 2013  1:28 PM       Inform bx is benign!! No hyperplasia ------

## 2013-11-14 ENCOUNTER — Ambulatory Visit: Payer: 59 | Admitting: Gynecology

## 2014-02-06 ENCOUNTER — Encounter: Payer: Self-pay | Admitting: Gynecology

## 2014-02-21 ENCOUNTER — Encounter: Payer: Self-pay | Admitting: Certified Nurse Midwife

## 2014-02-21 ENCOUNTER — Ambulatory Visit (INDEPENDENT_AMBULATORY_CARE_PROVIDER_SITE_OTHER): Payer: 59 | Admitting: Certified Nurse Midwife

## 2014-02-21 VITALS — BP 120/80 | HR 68 | Resp 16 | Ht 61.5 in | Wt 220.0 lb

## 2014-02-21 DIAGNOSIS — Z124 Encounter for screening for malignant neoplasm of cervix: Secondary | ICD-10-CM

## 2014-02-21 DIAGNOSIS — Z01419 Encounter for gynecological examination (general) (routine) without abnormal findings: Secondary | ICD-10-CM

## 2014-02-21 NOTE — Progress Notes (Signed)
53 y.o. [id number] Married Caucasian Fe here for annual exam. Menopausal no HRT. Denies any vaginal bleeding or vaginal dryness. Sees PCP for aex and labs. Normal per patient( brought copies). No health issues today. Son getting married!  Patient's last menstrual period was 02/11/2013.          Sexually active: Yes.    The current method of family planning is vasectomy.    Exercising: No.  exercise Smoker:  no  Health Maintenance: Pap:  02-17-13 neg  MMG:  03-07-13 category A density, birads category 1:neg Colonoscopy:  2012 polyp f/u 5 yrs BMD:   none TDaP:  02-07-14 Labs: none Self breast exam: not done   reports that she has quit smoking. She does not have any smokeless tobacco history on file. She reports that she does not drink alcohol or use illicit drugs.  History reviewed. No pertinent past medical history.  Past Surgical History  Procedure Laterality Date  . Dilatation & curettage/hysteroscopy with trueclear N/A 07/27/2013    Procedure: DILATATION & CURETTAGE/HYSTEROSCOPY WITH TRUCLEAR;  Surgeon: Azalia Bilis, MD;  Location: [facility];  Service: Gynecology;  Laterality: N/A;    No current outpatient prescriptions on file.   No current facility-administered medications for this visit.    Family History  Problem Relation Age of Onset  . Breast cancer Mother   . Cancer Mother     lung  . Cancer Father     pancreatic  . Heart disease Father   . Diabetes Father   . Cancer Maternal Grandmother   . Cancer Paternal Grandmother     pancreatic    ROS:  Pertinent items are noted in HPI.  Otherwise, a comprehensive ROS was negative.  Exam:   BP 120/80 mmHg  Pulse 68  Resp 16  Ht 5' 1.5" (1.562 m)  Wt 220 lb (99.791 kg)  BMI 40.90 kg/m2  LMP 02/11/2013 Height: 5' 1.5" (156.2 cm)  Ht Readings from Last 3 Encounters:  02/21/14 5' 1.5" (1.562 m)  11/09/13 5' 1.75" (1.568 m)  08/10/13 5' 1.75" (1.568 m)    General appearance: alert, cooperative and appears stated  age Head: Normocephalic, without obvious abnormality, atraumatic Neck: no adenopathy, supple, symmetrical, trachea midline and thyroid normal to inspection and palpation Lungs: clear to auscultation bilaterally Breasts: normal appearance, no masses or tenderness, No nipple retraction or dimpling, No nipple discharge or bleeding, No axillary or supraclavicular adenopathy Heart: regular rate and rhythm Abdomen: soft, non-tender; no masses,  no organomegaly Extremities: extremities normal, atraumatic, no cyanosis or edema Skin: Skin color, texture, turgor normal. No rashes or lesions Lymph nodes: Cervical, supraclavicular, and axillary nodes normal. No abnormal inguinal nodes palpated Neurologic: Grossly normal   Pelvic: External genitalia:  no lesions              Urethra:  normal appearing urethra with no masses, tenderness or lesions              Bartholin's and Skene's: normal                 Vagina: normal appearing vagina with normal color and discharge, no lesions              Cervix: normal, non tender, no lesions              Pap taken: Yes.   Bimanual Exam:  Uterus:  normal size, contour, position, consistency, mobility, non-tender and anteverted  Adnexa: normal adnexa and no mass, fullness, tenderness               Rectovaginal: Confirms               Anus:  normal sphincter tone, no lesions  A:  Well Woman with normal exam  Menopausal no HRT  Previous PMP with D&C with benign biopsy    P:   Reviewed health and wellness pertinent to exam  Aware of need to advise if PMP again  Pap smear taken today with HPV reflex     counseled on breast self exam, mammography screening, adequate intake of calcium and vitamin D, diet and exercise, Kegel's exercises  return annually or prn  An After Visit Summary was printed and given to the patient.

## 2014-02-21 NOTE — Patient Instructions (Signed)

## 2014-02-23 LAB — IPS PAP TEST WITH REFLEX TO HPV

## 2014-02-24 ENCOUNTER — Telehealth: Payer: Self-pay | Admitting: Obstetrics & Gynecology

## 2014-02-24 NOTE — Progress Notes (Signed)
Reviewed personally.  M. Suzanne Irean Kendricks, MD.  

## 2014-02-24 NOTE — Telephone Encounter (Signed)
Opened in error

## 2015-02-23 DIAGNOSIS — Z801 Family history of malignant neoplasm of trachea, bronchus and lung: Secondary | ICD-10-CM | POA: Insufficient documentation

## 2015-03-06 ENCOUNTER — Ambulatory Visit: Payer: 59 | Admitting: Certified Nurse Midwife

## 2015-03-27 ENCOUNTER — Ambulatory Visit (INDEPENDENT_AMBULATORY_CARE_PROVIDER_SITE_OTHER): Payer: Commercial Managed Care - HMO | Admitting: Certified Nurse Midwife

## 2015-03-27 ENCOUNTER — Encounter: Payer: Self-pay | Admitting: Certified Nurse Midwife

## 2015-03-27 VITALS — BP 128/80 | HR 74 | Resp 16 | Ht 61.25 in | Wt 213.0 lb

## 2015-03-27 DIAGNOSIS — Z01419 Encounter for gynecological examination (general) (routine) without abnormal findings: Secondary | ICD-10-CM | POA: Diagnosis not present

## 2015-03-27 NOTE — Progress Notes (Signed)
54 y.o. [id number] Married  Caucasian Fe here for annual exam. Denies vaginal bleeding or vaginal dryness. Diagnosed with sleep apnea, but declined Cpap machine. Sees  Dr. Radene Gunning for aex and labs. Doing well, working on weight loss, down 7 pounds now. Planning cruise for 2018 and plans to be fit to do it! No other health issues today  Patient's last menstrual period was 02/11/2013.          Sexually active: Yes.    The current method of family planning is vasectomy.    Exercising: No.  exercise Smoker:  no  Health Maintenance: Pap: 02-21-14 neg MMG:  03-16-14 category b, left breast u/s f/u.cyst birads 2:neg, scheduled for tomorrow Colonoscopy: 2012 polyp f/u 51yrs BMD:   none TDaP: 2015 Shingles: no Pneumonia: no Hep C and HIV: declines will do with PCP Labs: pcp Self breast exam: not done   reports that she has quit smoking. She has never used smokeless tobacco. She reports that she does not drink alcohol or use illicit drugs.  History reviewed. No pertinent past medical history.  Past Surgical History  Procedure Laterality Date  . Dilatation & curettage/hysteroscopy with trueclear N/A 07/27/2013    Procedure: DILATATION & CURETTAGE/HYSTEROSCOPY WITH TRUCLEAR;  Surgeon: Azalia Bilis, MD;  Location: [facility];  Service: Gynecology;  Laterality: N/A;  . Endometrial biopsy  2015    neg    No current outpatient prescriptions on file.   No current facility-administered medications for this visit.    Family History  Problem Relation Age of Onset  . Breast cancer Mother   . Cancer Mother     lung  . Cancer Father     pancreatic  . Heart disease Father   . Diabetes Father   . Cancer Maternal Grandmother   . Cancer Paternal Grandmother     pancreatic    ROS:  Pertinent items are noted in HPI.  Otherwise, a comprehensive ROS was negative.  Exam:   BP 128/80 mmHg  Pulse 74  Resp 16  Ht 5' 1.25" (1.556 m)  Wt 213 lb (96.616 kg)  BMI 39.91 kg/m2  LMP 02/11/2013 Height: 5'  1.25" (155.6 cm) Ht Readings from Last 3 Encounters:  03/27/15 5' 1.25" (1.556 m)  02/21/14 5' 1.5" (1.562 m)  11/09/13 5' 1.75" (1.568 m)    General appearance: alert, cooperative and appears stated age Head: Normocephalic, without obvious abnormality, atraumatic Neck: no adenopathy, supple, symmetrical, trachea midline and thyroid normal to inspection and palpation Lungs: clear to auscultation bilaterally Breasts: normal appearance, no masses or tenderness, No nipple retraction or dimpling, No nipple discharge or bleeding, No axillary or supraclavicular adenopathy Heart: regular rate and rhythm Abdomen: soft, non-tender; no masses,  no organomegaly Extremities: extremities normal, atraumatic, no cyanosis or edema Skin: Skin color, texture, turgor normal. No rashes or lesions Lymph nodes: Cervical, supraclavicular, and axillary nodes normal. No abnormal inguinal nodes palpated Neurologic: Grossly normal   Pelvic: External genitalia:  no lesions              Urethra:  normal appearing urethra with no masses, tenderness or lesions              Bartholin's and Skene's: normal                 Vagina: normal appearing vagina with normal color and discharge, no lesions              Cervix: normal,no lesions or tenderness  Pap taken: No. Bimanual Exam:  Uterus:  normal size, contour, position, consistency, mobility, non-tender              Adnexa: normal adnexa and no mass, fullness, tenderness               Rectovaginal: Confirms               Anus:  normal sphincter tone, no lesions  Chaperone present: yes  A:  Well Woman with normal exam  Menopausal no HRT  Weight loss intentional, continues to work on  Labs with PCP all normal copy to be scanned in chart.  P:   Reviewed health and wellness pertinent to exam  Aware of need to evaluate if vaginal bleeding  Encouraged to continue weight loss journey!  Pap smear as above not taken   counseled on breast self exam,  mammography screening, adequate intake of calcium and vitamin D, diet and exercise  return annually or prn  An After Visit Summary was printed and given to the patient.

## 2015-03-27 NOTE — Patient Instructions (Signed)

## 2015-03-27 NOTE — Progress Notes (Signed)
Reviewed personally.  M. Suzanne Vanessia Bokhari, MD.  

## 2016-04-11 ENCOUNTER — Ambulatory Visit (INDEPENDENT_AMBULATORY_CARE_PROVIDER_SITE_OTHER): Payer: Commercial Managed Care - HMO | Admitting: Certified Nurse Midwife

## 2016-04-11 ENCOUNTER — Encounter: Payer: Self-pay | Admitting: Certified Nurse Midwife

## 2016-04-11 VITALS — BP 110/80 | HR 70 | Resp 16 | Ht 61.5 in | Wt 225.0 lb

## 2016-04-11 DIAGNOSIS — Z01419 Encounter for gynecological examination (general) (routine) without abnormal findings: Secondary | ICD-10-CM

## 2016-04-11 DIAGNOSIS — Z124 Encounter for screening for malignant neoplasm of cervix: Secondary | ICD-10-CM

## 2016-04-11 NOTE — Patient Instructions (Signed)

## 2016-04-11 NOTE — Progress Notes (Signed)
56 y.o. [id number] Married  Caucasian Fe here for annual exam. Menopausal no HRT. Denies vaginal dryness or vaginal dryness. Sees PCP for aex and labs. Slight weight gain, and working with management of with PCP. No other health issues today.  Patient's last menstrual period was 02/11/2013.          Sexually active: Yes.    The current method of family planning is vasectomy.    Exercising: No.  exercise Smoker:  no  Health Maintenance: Pap:  02-21-14 neg MMG:  03-28-16 category b density birads 1:neg Colonoscopy:  2012 polyp f/u 44yrs due now BMD:   none TDaP:  2015 Shingles: no Pneumonia: no Hep C and HIV: not done Labs: pcp Self breast exam: not done   reports that she has quit smoking. She has never used smokeless tobacco. She reports that she does not drink alcohol or use drugs.  History reviewed. No pertinent past medical history.  Past Surgical History:  Procedure Laterality Date  . DILATATION & CURETTAGE/HYSTEROSCOPY WITH TRUECLEAR N/A 07/27/2013   Procedure: DILATATION & CURETTAGE/HYSTEROSCOPY WITH TRUCLEAR;  Surgeon: Azalia Bilis, MD;  Location: [facility];  Service: Gynecology;  Laterality: N/A;  . ENDOMETRIAL BIOPSY  2015   neg    No current outpatient prescriptions on file.   No current facility-administered medications for this visit.     Family History  Problem Relation Age of Onset  . Breast cancer Mother   . Cancer Mother     lung  . Cancer Father     pancreatic  . Heart disease Father   . Diabetes Father   . Cancer Maternal Grandmother   . Cancer Paternal Grandmother     pancreatic    ROS:  Pertinent items are noted in HPI.  Otherwise, a comprehensive ROS was negative.  Exam:   BP 110/80   Pulse 70   Resp 16   Ht 5' 1.5" (1.562 m)   Wt 225 lb (102.1 kg)   LMP 02/11/2013   BMI 41.82 kg/m  Height: 5' 1.5" (156.2 cm) Ht Readings from Last 3 Encounters:  04/11/16 5' 1.5" (1.562 m)  03/27/15 5' 1.25" (1.556 m)  02/21/14 5' 1.5" (1.562 m)     General appearance: alert, cooperative and appears stated age Head: Normocephalic, without obvious abnormality, atraumatic Neck: no adenopathy, supple, symmetrical, trachea midline and thyroid normal to inspection and palpation Lungs: clear to auscultation bilaterally Breasts: normal appearance, no masses or tenderness, No nipple retraction or dimpling, No nipple discharge or bleeding, No axillary or supraclavicular adenopathy Heart: regular rate and rhythm Abdomen: soft, non-tender; no masses,  no organomegaly Extremities: extremities normal, atraumatic, no cyanosis or edema Skin: Skin color, texture, turgor normal. No rashes or lesions Lymph nodes: Cervical, supraclavicular, and axillary nodes normal. No abnormal inguinal nodes palpated Neurologic: Grossly normal   Pelvic: External genitalia:  no lesions              Urethra:  normal appearing urethra with no masses, tenderness or lesions              Bartholin's and Skene's: normal                 Vagina: normal appearing vagina with normal color and discharge, no lesions              Cervix: no bleeding following Pap, no cervical motion tenderness and no lesions              Pap taken: Yes.  Bimanual Exam:  Uterus:  normal size, contour, position, consistency, mobility, non-tender              Adnexa: normal adnexa and no mass, fullness, tenderness               Rectovaginal: Confirms               Anus:  normal sphincter tone, no lesions  Chaperone present: yes  A:  Well Woman with normal exam  Menopausal no HRT  Colonoscopy due    P:   Reviewed health and wellness pertinent to exam  Aware of need to evaluate if vaginal bleeding  Stressed importance of colonoscopy, patient plans to schedule  Pap smear as above with HPVHR   counseled on breast self exam, mammography screening, adequate intake of calcium and vitamin D, diet and exercise  return annually or prn  An After Visit Summary was printed and given to the  patient.

## 2016-04-15 LAB — IPS PAP TEST WITH HPV

## 2016-04-19 NOTE — Progress Notes (Signed)
Encounter reviewed Kellin Fifer, MD   

## 2016-07-29 DIAGNOSIS — R030 Elevated blood-pressure reading, without diagnosis of hypertension: Secondary | ICD-10-CM | POA: Diagnosis not present

## 2016-07-29 DIAGNOSIS — G5601 Carpal tunnel syndrome, right upper limb: Secondary | ICD-10-CM | POA: Diagnosis not present

## 2016-10-07 DIAGNOSIS — K5790 Diverticulosis of intestine, part unspecified, without perforation or abscess without bleeding: Secondary | ICD-10-CM | POA: Diagnosis not present

## 2016-10-07 DIAGNOSIS — Z800 Family history of malignant neoplasm of digestive organs: Secondary | ICD-10-CM | POA: Diagnosis not present

## 2016-10-07 DIAGNOSIS — Z1211 Encounter for screening for malignant neoplasm of colon: Secondary | ICD-10-CM | POA: Diagnosis not present

## 2016-10-07 DIAGNOSIS — K5730 Diverticulosis of large intestine without perforation or abscess without bleeding: Secondary | ICD-10-CM | POA: Diagnosis not present

## 2016-12-15 ENCOUNTER — Encounter (HOSPITAL_COMMUNITY): Payer: Self-pay | Admitting: Emergency Medicine

## 2016-12-15 ENCOUNTER — Emergency Department (HOSPITAL_COMMUNITY): Payer: 59

## 2016-12-15 ENCOUNTER — Emergency Department (HOSPITAL_COMMUNITY)
Admission: EM | Admit: 2016-12-15 | Discharge: 2016-12-16 | Disposition: A | Discharge status: Home / Self Care | Payer: 59 | Attending: Emergency Medicine | Admitting: Emergency Medicine

## 2016-12-15 DIAGNOSIS — M62830 Muscle spasm of back: Secondary | ICD-10-CM | POA: Diagnosis not present

## 2016-12-15 DIAGNOSIS — Z87891 Personal history of nicotine dependence: Secondary | ICD-10-CM | POA: Diagnosis not present

## 2016-12-15 DIAGNOSIS — R079 Chest pain, unspecified: Secondary | ICD-10-CM | POA: Diagnosis not present

## 2016-12-15 DIAGNOSIS — R202 Paresthesia of skin: Secondary | ICD-10-CM | POA: Insufficient documentation

## 2016-12-15 DIAGNOSIS — M5489 Other dorsalgia: Secondary | ICD-10-CM | POA: Diagnosis present

## 2016-12-15 LAB — I-STAT TROPONIN, ED: Troponin i, poc: 0 ng/mL (ref 0.00–0.08)

## 2016-12-15 NOTE — ED Notes (Addendum)
Patient presented from the waiting room stating that Labor [place] weekend [place] the [place].  She noticed when she stood up she felt dizzy (happened a couple of times through out the week).  Also stated her left arm above the elbow has been hurting as well as her left lower back.  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  Denies numbness to her arms and legs at this time.  Called her MD and was told to come to the ED to be seen.

## 2016-12-15 NOTE — ED Notes (Signed)
Pt and pt husband present to this RN at [facility] with concerns that no blood  Work or EKG was completed when her MD sent her here to rule out MI with complaints of left back pain and arm pain. Dr. Wilson Singer made aware and orders for troponin and EKG obtained.

## 2016-12-15 NOTE — ED Notes (Addendum)
Lungs clear bilaterally  Denies CP, cough, etc.

## 2016-12-15 NOTE — ED Triage Notes (Signed)
Pt sts left back pain and pain into left arm x 1 week

## 2016-12-16 MED ORDER — CYCLOBENZAPRINE HCL 10 MG PO TABS: 10.0000 mg | ORAL_TABLET | Freq: Two times a day (BID) | ORAL | 0 refills | Status: DC | PRN

## 2016-12-16 MED ORDER — NAPROXEN 375 MG PO TABS: 375.0000 mg | ORAL_TABLET | Freq: Two times a day (BID) | ORAL | 0 refills | Status: DC

## 2016-12-16 NOTE — ED Provider Notes (Signed)
Catherine [name] Note   CSN: [id number] Arrival date & time: 12/15/16  1630     History   Chief Complaint Chief Complaint  Patient presents with  . Back Pain    HPI Catherine [name] is a 56 y.o. female.  Patient without significant medical history presents with complaint of left upper back pain she describes as sharp stabbing pain with certain movements. No pain with breathing or SOB. No cough or fever. No known injury. She also has an ache in her left arm with some tingling. No weakness or numbness. No neck pain. She denies chest pain, nausea, or diaphoresis. She has tried taking Naproxen without relief.    The history is provided by the patient. No language interpreter was used.    History reviewed. No pertinent past medical history.  Patient Active Problem List   Diagnosis Date Noted  . Obesity, morbid ([place]) 03/08/2013  . Post-menopausal bleeding 03/08/2013    Past Surgical History:  Procedure Laterality Date  . DILATATION & CURETTAGE/HYSTEROSCOPY WITH TRUECLEAR N/A 07/27/2013   Procedure: DILATATION & CURETTAGE/HYSTEROSCOPY WITH TRUCLEAR;  Surgeon: Azalia Bilis, MD;  Location: [facility];  Service: Gynecology;  Laterality: N/A;  . ENDOMETRIAL BIOPSY  2015   neg    OB History    Gravida Para Term Preterm AB Living   4 2 2   2 2    SAB TAB Ectopic Multiple Live Births   2       2       Home Medications    Prior to Admission medications   Not on File    Family History Family History  Problem Relation Age of Onset  . Breast cancer Mother   . Cancer Mother        lung  . Cancer Father        pancreatic  . Heart disease Father   . Diabetes Father   . Cancer Maternal Grandmother   . Cancer Paternal Grandmother        pancreatic    Social History Social History  Substance Use Topics  . Smoking status: Former Research scientist (life sciences)  . Smokeless tobacco: Never Used  . Alcohol use No     Allergies   Patient has no known allergies.   Review of  Systems Review of Systems  Constitutional: Negative for chills, diaphoresis and fever.  Respiratory: Negative.  Negative for cough and shortness of breath.   Cardiovascular: Negative.  Negative for chest pain and leg swelling.  Gastrointestinal: Negative.  Negative for abdominal pain and nausea.  Musculoskeletal: Negative for neck pain.       See HPI.  Skin: Negative.   Neurological: Negative.  Negative for weakness and numbness.       See HPI.     Physical Exam Updated Vital Signs BP (!) 154/92   Pulse 75   Temp 98.5 F (36.9 C) (Oral)   Resp 17   LMP 02/11/2013   SpO2 98%   Physical Exam  Constitutional: She is oriented to person, place, and time. She appears well-developed and well-nourished.  HENT:  Head: Normocephalic.  Neck: Normal range of motion. Neck supple.  Cardiovascular: Normal rate, regular rhythm and intact distal pulses.   Pulmonary/Chest: Effort normal and breath sounds normal. She has no wheezes. She has no rales. She exhibits no tenderness.  Abdominal: Soft. Bowel sounds are normal. There is no tenderness. There is no rebound and no guarding.  Musculoskeletal: Normal range of motion.  No reproducible tenderness of  left thoracic back. Left arm nontender, without swelling or discoloration. 5/5 grip strength. FROM. No midline cervical or paracervical tenderness.   Neurological: She is alert and oriented to person, place, and time. No sensory deficit. She exhibits normal muscle tone.  Skin: Skin is warm and dry. No rash noted.  Psychiatric: She has a normal mood and affect.     ED Treatments / Results  Labs (all labs ordered are listed, but only abnormal results are displayed) Labs Reviewed  I-STAT TROPONIN, ED    EKG  EKG Interpretation None       Radiology Dg Chest 2 View  Result Date: 12/15/2016 CLINICAL DATA:  Left back pain and pain in the left arm x1 week without known injury. EXAM: CHEST  2 VIEW COMPARISON:  None. FINDINGS: The heart size  and mediastinal contours are within normal limits. Both lungs are clear. Slight multilevel disc space narrowing along the dorsal spine, degenerative in etiology. No acute nor suspicious osseous abnormality. IMPRESSION: No active cardiopulmonary disease. Electronically Signed   By: Ashley Royalty M.D.   On: 12/15/2016 17:16    Procedures Procedures (including critical care time)  Medications Ordered in ED Medications - No data to display   Initial Impression / Assessment and Plan / ED Course  I have reviewed the triage vital signs and the nursing notes.  Pertinent labs & imaging results that were available during my care of the patient were reviewed by me and considered in my medical decision making (see chart for details).     Patient with 2 week history of upper left back spasm type pain, and left arm discomfort with tingling. No respiratory symptoms or chest pain.  She has no neurologic deficits on exam. No cervical exam findings and no subjective pain. DDx of left arm paresthesias would still include radicular origin. Doubt cardiac with negative troponin, NSR EKG and clear CXR. For now, will treat with supportive, symptomatic treatment and PCP follow up for recheck.   Thoracic discomfort follows pattern of spasm. Will treat with muscle relaxer and continued Naproxen.   Final Clinical Impressions(s) / ED Diagnoses   Final diagnoses:  None   1. Muscle spasm of back 2. Left arm paresthesia.  New Prescriptions New Prescriptions   No medications on file     Charlann Lange, Hershal Coria 12/16/16 0133    Merryl Hacker, MD 12/17/16 [phone]

## 2016-12-16 NOTE — ED Notes (Signed)
Discharge instructions and prescriptions reviewed - voiced understanding 

## 2017-03-10 DIAGNOSIS — Z0000 Encounter for general adult medical examination without abnormal findings: Secondary | ICD-10-CM | POA: Diagnosis not present

## 2017-03-17 DIAGNOSIS — Z23 Encounter for immunization: Secondary | ICD-10-CM | POA: Diagnosis not present

## 2017-03-17 DIAGNOSIS — R030 Elevated blood-pressure reading, without diagnosis of hypertension: Secondary | ICD-10-CM | POA: Diagnosis not present

## 2017-03-17 DIAGNOSIS — R7301 Impaired fasting glucose: Secondary | ICD-10-CM | POA: Diagnosis not present

## 2017-03-17 DIAGNOSIS — Z0000 Encounter for general adult medical examination without abnormal findings: Secondary | ICD-10-CM | POA: Diagnosis not present

## 2017-04-09 DIAGNOSIS — Z1231 Encounter for screening mammogram for malignant neoplasm of breast: Secondary | ICD-10-CM | POA: Diagnosis not present

## 2017-04-16 ENCOUNTER — Ambulatory Visit: Payer: 59 | Admitting: Certified Nurse Midwife

## 2017-04-16 ENCOUNTER — Other Ambulatory Visit: Payer: Self-pay

## 2017-04-16 ENCOUNTER — Encounter: Payer: Self-pay | Admitting: Certified Nurse Midwife

## 2017-04-16 VITALS — BP 124/78 | HR 70 | Resp 16 | Ht 61.25 in | Wt 234.0 lb

## 2017-04-16 DIAGNOSIS — N951 Menopausal and female climacteric states: Secondary | ICD-10-CM

## 2017-04-16 DIAGNOSIS — Z01419 Encounter for gynecological examination (general) (routine) without abnormal findings: Secondary | ICD-10-CM | POA: Diagnosis not present

## 2017-04-16 DIAGNOSIS — Z23 Encounter for immunization: Secondary | ICD-10-CM

## 2017-04-16 NOTE — Progress Notes (Signed)
57 y.o. [id number] Married  Caucasian Fe here for annual exam. Denies vaginal bleeding or vaginal dryness. No hot flashes and night sweats. Sees Dr. Dagmar Hait for aex , labs . Glucose elevated and HGB A1-C 7.0. Under follow up with PCP. No health issues today. Would like to update TDAP, due to new grandchild..  Patient's last menstrual period was 02/11/2013.          Sexually active: Yes.    The current method of family planning is vasectomy.    Exercising: No.  exercise Smoker:  no  Health Maintenance: Pap:  02-21-14 neg, 04-11-16 neg HPV HR neg History of Abnormal Pap: yes MMG:  1/19 neg pt to sign release Self Breast exams: no Colonoscopy:  2018  BMD:   none Tetanus given 2015, Tdap given today Shingles: no Pneumonia: no Hep C and HIV: not done Labs: no   reports that she has quit smoking. she has never used smokeless tobacco. She reports that she does not drink alcohol or use drugs.  History reviewed. No pertinent past medical history.  Past Surgical History:  Procedure Laterality Date  . DILATATION & CURETTAGE/HYSTEROSCOPY WITH TRUECLEAR N/A 07/27/2013   Procedure: DILATATION & CURETTAGE/HYSTEROSCOPY WITH TRUCLEAR;  Surgeon: Azalia Bilis, MD;  Location: [facility];  Service: Gynecology;  Laterality: N/A;  . ENDOMETRIAL BIOPSY  2015   neg    No current outpatient medications on file.   No current facility-administered medications for this visit.     Family History  Problem Relation Age of Onset  . Breast cancer Mother   . Cancer Mother        lung  . Cancer Father        pancreatic  . Heart disease Father   . Diabetes Father   . Cancer Maternal Grandmother   . Cancer Paternal Grandmother        pancreatic    ROS:  Pertinent items are noted in HPI.  Otherwise, a comprehensive ROS was negative.  Exam:   BP 124/78   Pulse 70   Resp 16   Ht 5' 1.25" (1.556 m)   Wt 234 lb (106.1 kg)   LMP 02/11/2013   BMI 43.85 kg/m  Height: 5' 1.25" (155.6 cm) Ht Readings from Last  3 Encounters:  04/16/17 5' 1.25" (1.556 m)  04/11/16 5' 1.5" (1.562 m)  03/27/15 5' 1.25" (1.556 m)    General appearance: alert, cooperative and appears stated age Head: Normocephalic, without obvious abnormality, atraumatic Neck: no adenopathy, supple, symmetrical, trachea midline and thyroid normal to inspection and palpation Lungs: clear to auscultation bilaterally Breasts: normal appearance, no masses or tenderness, No nipple retraction or dimpling, No nipple discharge or bleeding, No axillary or supraclavicular adenopathy Heart: regular rate and rhythm Abdomen: soft, non-tender; no masses,  no organomegaly Extremities: extremities normal, atraumatic, no cyanosis or edema Skin: Skin color, texture, turgor normal. No rashes or lesions Lymph nodes: Cervical, supraclavicular, and axillary nodes normal. No abnormal inguinal nodes palpated Neurologic: Grossly normal   Pelvic: External genitalia:  no lesions              Urethra:  normal appearing urethra with no masses, tenderness or lesions              Bartholin's and Skene's: normal                 Vagina: normal appearing vagina with normal color and discharge, no lesions  Cervix: multiparous appearance, no cervical motion tenderness and no lesions              Pap taken: No. Bimanual Exam:  Uterus:  normal size, contour, position, consistency, mobility, non-tender              Adnexa: normal adnexa and no mass, fullness, tenderness               Rectovaginal: Confirms               Anus:  normal sphincter tone, no lesions  Chaperone present: yes  A:  Well Woman with normal exam  Menopausal no HRT  Obesity  Elevated Hgb A1-C with PCP working on diet control at present.  Immunization update  P:   Reviewed health and wellness pertinent to exam  Aware of need to advise if vaginal bleeding  Encouraged patient to take her weight loss journey seriously for her overall health  Continue follow up with PCP as  indicated  Requests TDAP, was only given Tetanus update in 2015( verified at medical office)  Pap smear: no   counseled on breast self exam, mammography screening, feminine hygiene, adequate intake of calcium and vitamin D, diet and exercise  return annually or prn  An After Visit Summary was printed and given to the patient.

## 2017-04-16 NOTE — Patient Instructions (Signed)

## 2017-06-01 DIAGNOSIS — M25569 Pain in unspecified knee: Secondary | ICD-10-CM | POA: Insufficient documentation

## 2017-06-01 DIAGNOSIS — M25561 Pain in right knee: Secondary | ICD-10-CM | POA: Diagnosis not present

## 2017-06-01 DIAGNOSIS — M238X1 Other internal derangements of right knee: Secondary | ICD-10-CM | POA: Diagnosis not present

## 2017-06-09 DIAGNOSIS — M25561 Pain in right knee: Secondary | ICD-10-CM | POA: Diagnosis not present

## 2017-06-24 DIAGNOSIS — M222X1 Patellofemoral disorders, right knee: Secondary | ICD-10-CM | POA: Diagnosis not present

## 2017-06-24 DIAGNOSIS — M1711 Unilateral primary osteoarthritis, right knee: Secondary | ICD-10-CM | POA: Diagnosis not present

## 2017-07-06 ENCOUNTER — Telehealth: Payer: Self-pay | Admitting: Certified Nurse Midwife

## 2017-07-06 NOTE — Telephone Encounter (Signed)
Patient says she has been bleeding for the past few days.

## 2017-07-06 NOTE — Telephone Encounter (Signed)
Spoke with patient. Patient reports that she started having spotting last Thursday. Woke up this morning and spotting has increased to light bleeding. Not having to wear a panty liner, pad, or tampon. Notices mainly with using the restroom. Denies any pain or other symptoms. Advised will need to be seen in office for further evaluation. Appointment scheduled for tomorrow 4/2 at 8:15 am with Dr.Jertson. Patient is agreeable to date and time. Aware if bleeding increases or develops new symptoms will need to be seen earlier with our office or ER. Patient is agreeable.   Routing to provider for final review. Patient agreeable to disposition. Will close encounter.

## 2017-07-07 ENCOUNTER — Encounter: Payer: Self-pay | Admitting: Obstetrics and Gynecology

## 2017-07-07 ENCOUNTER — Other Ambulatory Visit: Payer: Self-pay

## 2017-07-07 ENCOUNTER — Ambulatory Visit (INDEPENDENT_AMBULATORY_CARE_PROVIDER_SITE_OTHER): Payer: 59 | Admitting: Obstetrics and Gynecology

## 2017-07-07 VITALS — BP 132/86 | HR 76 | Resp 16 | Wt 235.0 lb

## 2017-07-07 DIAGNOSIS — Z8742 Personal history of other diseases of the female genital tract: Secondary | ICD-10-CM | POA: Diagnosis not present

## 2017-07-07 DIAGNOSIS — Z6841 Body Mass Index (BMI) 40.0 and over, adult: Secondary | ICD-10-CM

## 2017-07-07 DIAGNOSIS — N950 Postmenopausal bleeding: Secondary | ICD-10-CM

## 2017-07-07 NOTE — Progress Notes (Signed)
GYNECOLOGY  VISIT   HPI: 57 y.o.   Married  Caucasian  female   [phone] with Patient's last menstrual period was 02/11/2013.   here c/o postmenopause bleeding X 5 days. Started at the end of last week, spotting to light bleeding. No pain. No recent intercourse.   Last pap was normal in 1/18` In 2015 she was treated for complex endometrial hyperplasia without atypia.   GYNECOLOGIC HISTORY: Patient's last menstrual period was 02/11/2013. Contraception:postmenopause Menopausal hormone therapy: none         OB History    Gravida  4   Para  2   Term  2   Preterm      AB  2   Living  2     SAB  2   TAB      Ectopic      Multiple      Live Births  2              Patient Active Problem List   Diagnosis Date Noted  . Obesity, morbid ([place]) 03/08/2013  . Post-menopausal bleeding 03/08/2013    History reviewed. No pertinent past medical history.  Past Surgical History:  Procedure Laterality Date  . DILATATION & CURETTAGE/HYSTEROSCOPY WITH TRUECLEAR N/A 07/27/2013   Procedure: DILATATION & CURETTAGE/HYSTEROSCOPY WITH TRUCLEAR;  Surgeon: Azalia Bilis, MD;  Location: [facility];  Service: Gynecology;  Laterality: N/A;  . ENDOMETRIAL BIOPSY  2015   neg    No current outpatient medications on file.   No current facility-administered medications for this visit.      ALLERGIES: Patient has no known allergies.  Family History  Problem Relation Age of Onset  . Breast cancer Mother   . Cancer Mother        lung  . Cancer Father        pancreatic  . Heart disease Father   . Diabetes Father   . Cancer Maternal Grandmother   . Cancer Paternal Grandmother        pancreatic    Social History   Socioeconomic History  . Marital status: Married    Spouse name: Not on file  . Number of children: Not on file  . Years of education: Not on file  . Highest education level: Not on file  Occupational History  . Not on file  Social Needs  . Financial resource  strain: Not on file  . Food insecurity:    Worry: Not on file    Inability: Not on file  . Transportation needs:    Medical: Not on file    Non-medical: Not on file  Tobacco Use  . Smoking status: Former Research scientist (life sciences)  . Smokeless tobacco: Never Used  Substance and Sexual Activity  . Alcohol use: No  . Drug use: No  . Sexual activity: Yes    Partners: Male    Comment: husband vasectomy  Lifestyle  . Physical activity:    Days per week: Not on file    Minutes per session: Not on file  . Stress: Not on file  Relationships  . Social connections:    Talks on phone: Not on file    Gets together: Not on file    Attends religious service: Not on file    Active member of club or organization: Not on file    Attends meetings of clubs or organizations: Not on file    Relationship status: Not on file  . Intimate partner violence:    Fear of  current or ex partner: Not on file    Emotionally abused: Not on file    Physically abused: Not on file    Forced sexual activity: Not on file  Other Topics Concern  . Not on file  Social History Narrative  . Not on file    Review of Systems  Constitutional: Negative.   HENT: Negative.   Eyes: Negative.   Respiratory: Negative.   Cardiovascular: Negative.   Gastrointestinal: Negative.   Genitourinary:       PMB  Musculoskeletal: Negative.   Skin: Negative.   Neurological: Negative.   Endo/Heme/Allergies: Negative.   Psychiatric/Behavioral: Negative.     PHYSICAL EXAMINATION:    BP 132/86 (BP Location: Right Arm, Patient Position: Sitting, Cuff Size: Large)   Pulse 76   Resp 16   Wt 235 lb (106.6 kg)   LMP 02/11/2013   BMI 44.04 kg/m     General appearance: alert, cooperative and appears stated age Neck: no adenopathy, supple, symmetrical, trachea midline and thyroid normal to inspection and palpation Abdomen: soft, non-tender; non distended, no masses,  no organomegaly  Pelvic: External genitalia:  no lesions               Urethra:  normal appearing urethra with no masses, tenderness or lesions              Bartholins and Skenes: normal                 Vagina: normal appearing vagina with normal color and discharge, no lesions. Moderate amount of blood in the vagina              Cervix: no lesions              Bimanual Exam:  Uterus:  anteverted, mobile, not tender, not appreciably enlarged. Exam limited by BMI.               Adnexa: no mass, fullness, tenderness   The risks of endometrial biopsy were reviewed and a consent was obtained.  A speculum was placed in the vagina and the cervix was cleansed with betadine. A tenaculum was placed on the cervix and the mini-pipelle was placed into the endometrial cavity. The uterus sounded to 7cm. The endometrial biopsy was performed, 2 passes were made moderate tissue/blood was obtained. The tenaculum and speculum were removed. There were no complications.                  Chaperone was present for exam.  ASSESSMENT PMP bleeding in a patient with obesity and a h/o complex endometrial hyperplasia    PLAN Endometrial biopsy done Will set up an ultrasound and sonohysterogram (will await biopsy results to do the imaging) Further plans based on results   An After Visit Summary was printed and given to the patient.  CC: Evalee Mutton, CNM

## 2017-07-08 ENCOUNTER — Other Ambulatory Visit (HOSPITAL_COMMUNITY)
Admission: RE | Admit: 2017-07-08 | Discharge: 2017-07-08 | Disposition: A | Discharge status: Home / Self Care | Payer: 59 | Source: Ambulatory Visit | Attending: Obstetrics and Gynecology | Admitting: Obstetrics and Gynecology

## 2017-07-08 ENCOUNTER — Telehealth: Payer: Self-pay | Admitting: Obstetrics and Gynecology

## 2017-07-08 DIAGNOSIS — Z01419 Encounter for gynecological examination (general) (routine) without abnormal findings: Secondary | ICD-10-CM | POA: Diagnosis not present

## 2017-07-08 DIAGNOSIS — N950 Postmenopausal bleeding: Secondary | ICD-10-CM | POA: Insufficient documentation

## 2017-07-08 NOTE — Telephone Encounter (Signed)
Patient returning call.

## 2017-07-08 NOTE — Telephone Encounter (Signed)
Call placed to patient to review benefits for a recommended sonohysterogram. Left voicemail message requesting a return call.

## 2017-07-08 NOTE — Addendum Note (Signed)
Addended by: Dorothy Spark on: 07/08/2017 04:51 PM   Modules accepted: Orders

## 2017-07-09 NOTE — Telephone Encounter (Signed)
Returned call to patient to review benefits for recommended sonohysterogram, left voicemail requesting a return call

## 2017-07-09 NOTE — Telephone Encounter (Signed)
Spoke with patient regarding benefit for sonohysterogram. Patient understood and agreeable. Patient ready to schedule. Patient scheduled 07/21/17 with Dr Talbert Nan. Patient aware of appointment date, arrival time and cancellation policy. No further questions. Ok to close

## 2017-07-10 ENCOUNTER — Other Ambulatory Visit: Payer: Self-pay | Admitting: Obstetrics and Gynecology

## 2017-07-10 DIAGNOSIS — N950 Postmenopausal bleeding: Secondary | ICD-10-CM

## 2017-07-13 LAB — CYTOLOGY - PAP
Diagnosis: NEGATIVE
HPV: NOT DETECTED

## 2017-07-16 ENCOUNTER — Telehealth: Payer: Self-pay | Admitting: Obstetrics and Gynecology

## 2017-07-16 NOTE — Telephone Encounter (Signed)
Spoke with patient. Scheduled for PUS possible SHGM on 4/16, still bleeding, asking if ok to keep PUS as scheduled or reschedule?   Patient is postmenopausal. Reports flow goes from light to heavy, changes super tampon q3-4x/day. Denies any other symptoms.  Advised patient to keep PUS as scheduled. Return call to office if new symptoms develop or if bleeding becomes heavier, changing saturated tampon q1-2hrs. Dr. Talbert Nan will review, I will return call with any additional recommendations. Patient verbalizes understanding.   Routing to provider for final review. Patient is agreeable to disposition. Will close encounter.

## 2017-07-16 NOTE — Telephone Encounter (Signed)
Patient is still bleeding and has a SHMG scheduled for next week. Patient is asking if she will need to reschedule?

## 2017-07-21 ENCOUNTER — Other Ambulatory Visit: Payer: Self-pay | Admitting: Obstetrics and Gynecology

## 2017-07-21 ENCOUNTER — Ambulatory Visit (INDEPENDENT_AMBULATORY_CARE_PROVIDER_SITE_OTHER): Payer: 59 | Admitting: Obstetrics and Gynecology

## 2017-07-21 ENCOUNTER — Other Ambulatory Visit: Payer: Self-pay

## 2017-07-21 ENCOUNTER — Encounter: Payer: Self-pay | Admitting: Obstetrics and Gynecology

## 2017-07-21 ENCOUNTER — Ambulatory Visit (INDEPENDENT_AMBULATORY_CARE_PROVIDER_SITE_OTHER): Payer: 59

## 2017-07-21 VITALS — BP 138/72 | HR 84 | Resp 18 | Wt 235.0 lb

## 2017-07-21 DIAGNOSIS — N950 Postmenopausal bleeding: Secondary | ICD-10-CM | POA: Diagnosis not present

## 2017-07-21 MED ORDER — MEDROXYPROGESTERONE ACETATE 5 MG PO TABS: ORAL_TABLET | ORAL | 1 refills | Status: DC

## 2017-07-21 NOTE — Progress Notes (Signed)
GYNECOLOGY  VISIT   HPI: 57 y.o.   Married  Caucasian  female   [phone] with Patient's last menstrual period was 02/11/2013.   here for follow up postmenopause bleeding. She has a h/o endometrial hyperplasia without atypia in 2015. No bleeding since then until the end of last month. Still spotting. Pap was normal, endometrial biopsy with proliferative endometrium.     GYNECOLOGIC HISTORY: Patient's last menstrual period was 02/11/2013. Contraception:postmenopause  Menopausal hormone therapy: none         OB History    Gravida  4   Para  2   Term  2   Preterm      AB  2   Living  2     SAB  2   TAB      Ectopic      Multiple      Live Births  2              Patient Active Problem List   Diagnosis Date Noted  . Obesity, morbid ([place]) 03/08/2013  . Post-menopausal bleeding 03/08/2013    History reviewed. No pertinent past medical history.  Past Surgical History:  Procedure Laterality Date  . DILATATION & CURETTAGE/HYSTEROSCOPY WITH TRUECLEAR N/A 07/27/2013   Procedure: DILATATION & CURETTAGE/HYSTEROSCOPY WITH TRUCLEAR;  Surgeon: Azalia Bilis, MD;  Location: [facility];  Service: Gynecology;  Laterality: N/A;  . ENDOMETRIAL BIOPSY  2015   neg    No current outpatient medications on file.   No current facility-administered medications for this visit.      ALLERGIES: Patient has no known allergies.  Family History  Problem Relation Age of Onset  . Breast cancer Mother   . Cancer Mother        lung  . Cancer Father        pancreatic  . Heart disease Father   . Diabetes Father   . Cancer Maternal Grandmother   . Cancer Paternal Grandmother        pancreatic    Social History   Socioeconomic History  . Marital status: Married    Spouse name: Not on file  . Number of children: Not on file  . Years of education: Not on file  . Highest education level: Not on file  Occupational History  . Not on file  Social Needs  . Financial resource strain:  Not on file  . Food insecurity:    Worry: Not on file    Inability: Not on file  . Transportation needs:    Medical: Not on file    Non-medical: Not on file  Tobacco Use  . Smoking status: Former Research scientist (life sciences)  . Smokeless tobacco: Never Used  Substance and Sexual Activity  . Alcohol use: No  . Drug use: No  . Sexual activity: Yes    Partners: Male    Comment: husband vasectomy  Lifestyle  . Physical activity:    Days per week: Not on file    Minutes per session: Not on file  . Stress: Not on file  Relationships  . Social connections:    Talks on phone: Not on file    Gets together: Not on file    Attends religious service: Not on file    Active member of club or organization: Not on file    Attends meetings of clubs or organizations: Not on file    Relationship status: Not on file  . Intimate partner violence:    Fear of current or ex  partner: Not on file    Emotionally abused: Not on file    Physically abused: Not on file    Forced sexual activity: Not on file  Other Topics Concern  . Not on file  Social History Narrative  . Not on file    Review of Systems  Constitutional: Negative.   HENT: Negative.   Eyes: Negative.   Respiratory: Negative.   Cardiovascular: Negative.   Gastrointestinal: Negative.   Genitourinary:       Spotting   Musculoskeletal: Negative.   Skin: Negative.   Neurological: Negative.   Endo/Heme/Allergies: Negative.   Psychiatric/Behavioral: Negative.     PHYSICAL EXAMINATION:    BP 138/72 (BP Location: Right Arm, Patient Position: Sitting, Cuff Size: Normal)   Pulse 84   Resp 18   Wt 235 lb (106.6 kg)   LMP 02/11/2013   BMI 44.04 kg/m     General appearance: alert, cooperative and appears stated age  Pelvic: External genitalia:  no lesions              Urethra:  normal appearing urethra with no masses, tenderness or lesions              Bartholins and Skenes: normal                 Vagina: normal appearing vagina with normal color  and discharge, no lesions              Cervix: no lesion  Sonohysterogram The procedure and risks of the procedure were reviewed with the patient, consent form was signed. A speculum was placed in the vagina and the cervix was cleansed with betadine. The sonohysterogram catheter was inserted into the uterine cavity without difficulty. Saline was infused under direct observation with the ultrasound. No intracavitary defects were noted.The catheter was removed.    Chaperone was present for exam.  ASSESSMENT PMP bleeding, proliferative endometrium on biopsy, negative sonohysterogram, normal pap. She could have endometrial stimulation from adipose tissue.    PLAN FSH today Will treat with cyclic provera F/U in 6 months, calendar all bleeding   An After Visit Summary was printed and given to the patient.  ~15 minutes face to face time of which over 50% was spent in counseling.    CC: Evalee Mutton, CNM

## 2017-07-22 LAB — FOLLICLE STIMULATING HORMONE: FSH: 56.2 m[IU]/mL

## 2017-09-15 DIAGNOSIS — R7301 Impaired fasting glucose: Secondary | ICD-10-CM | POA: Diagnosis not present

## 2017-09-15 DIAGNOSIS — R030 Elevated blood-pressure reading, without diagnosis of hypertension: Secondary | ICD-10-CM | POA: Diagnosis not present

## 2017-09-15 DIAGNOSIS — K219 Gastro-esophageal reflux disease without esophagitis: Secondary | ICD-10-CM | POA: Insufficient documentation

## 2017-09-30 DIAGNOSIS — L821 Other seborrheic keratosis: Secondary | ICD-10-CM | POA: Diagnosis not present

## 2017-09-30 DIAGNOSIS — D1800 Hemangioma unspecified site: Secondary | ICD-10-CM | POA: Diagnosis not present

## 2017-09-30 DIAGNOSIS — L820 Inflamed seborrheic keratosis: Secondary | ICD-10-CM | POA: Diagnosis not present

## 2017-09-30 DIAGNOSIS — L918 Other hypertrophic disorders of the skin: Secondary | ICD-10-CM | POA: Diagnosis not present

## 2017-11-01 IMAGING — DX DG CHEST 2V
2 series · 2 of 2 positions shown · non-contrast
Comparison: None.

CLINICAL DATA: Left back pain and pain in the left arm x1 week
without known injury.

EXAM:
CHEST  2 VIEW

[w chest pa]
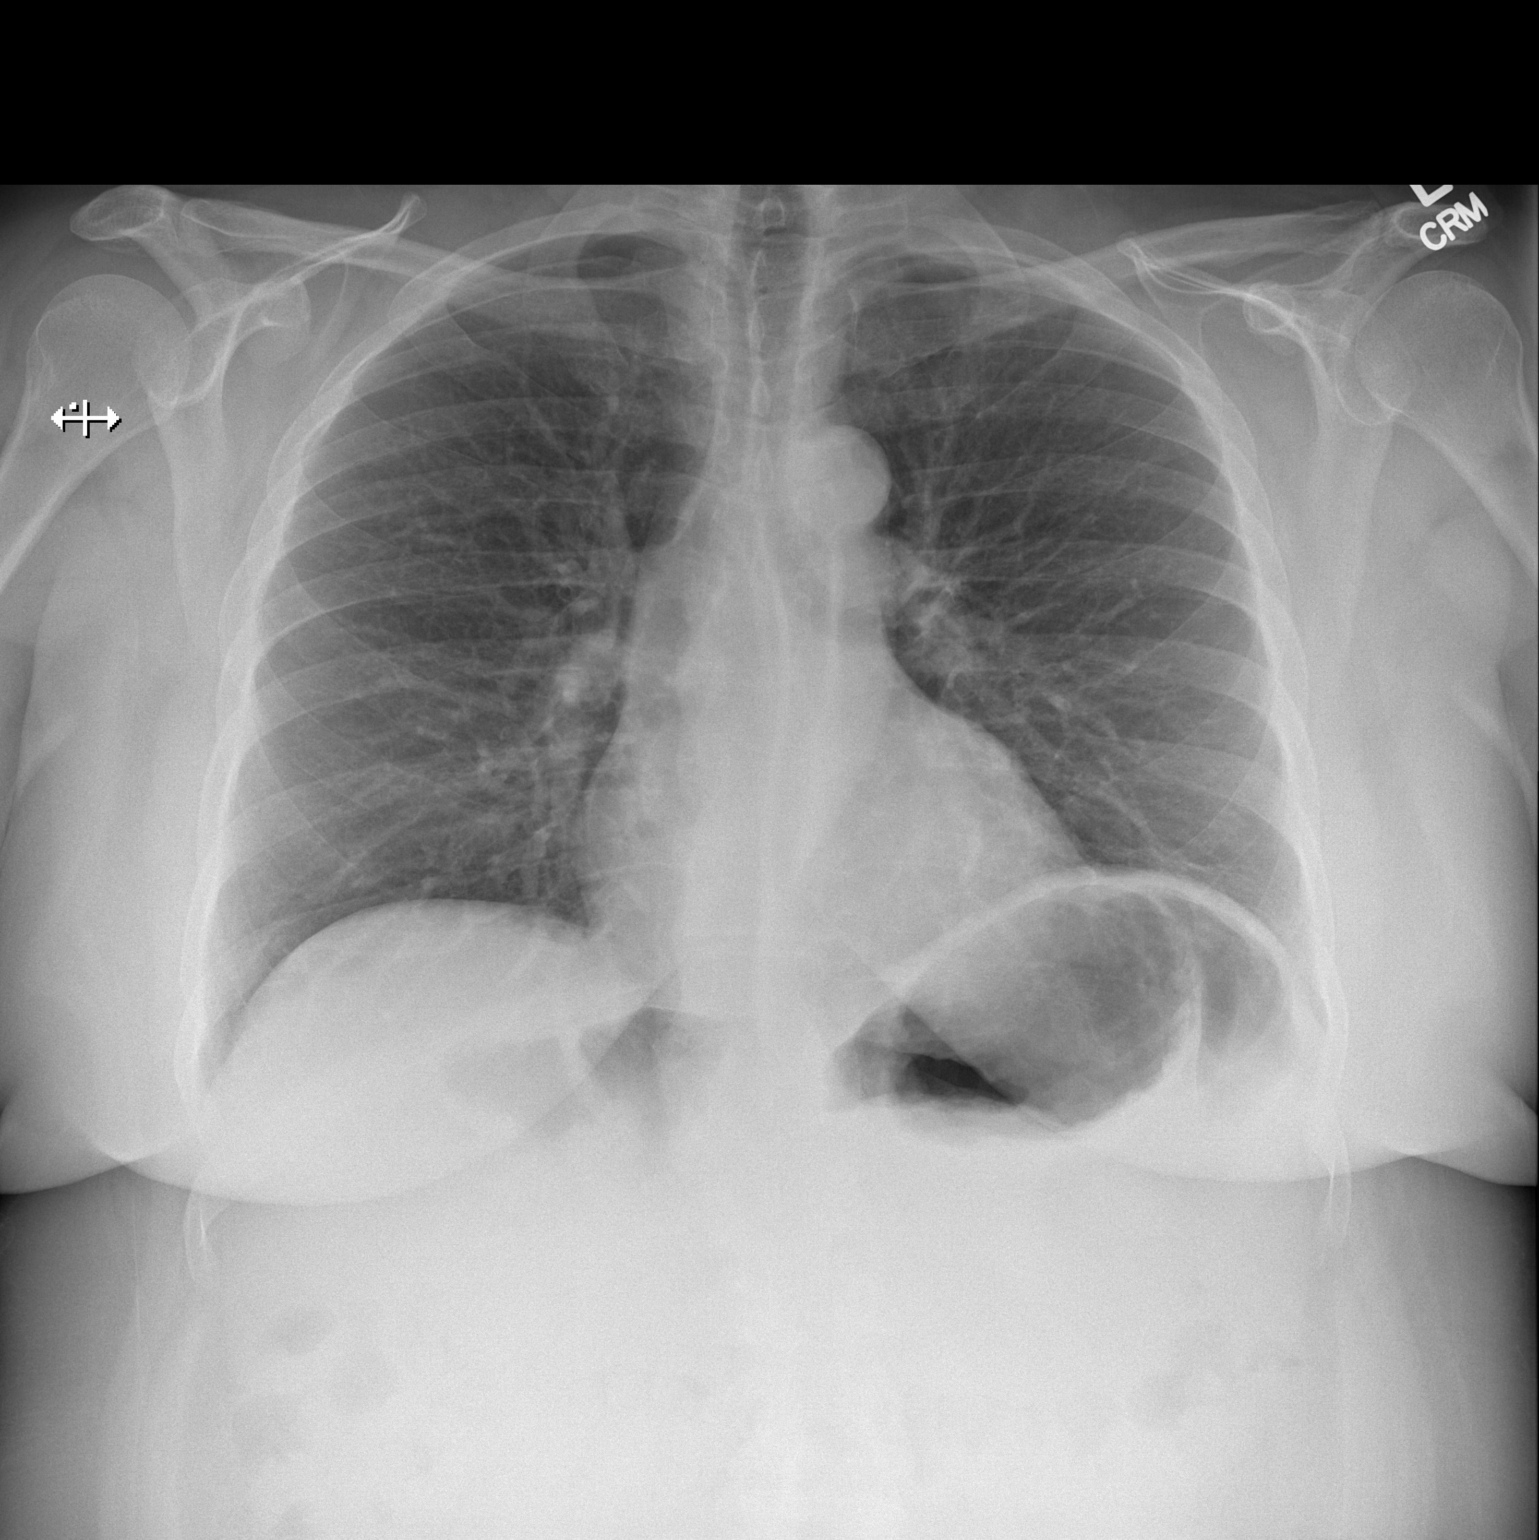

[w chest lat]
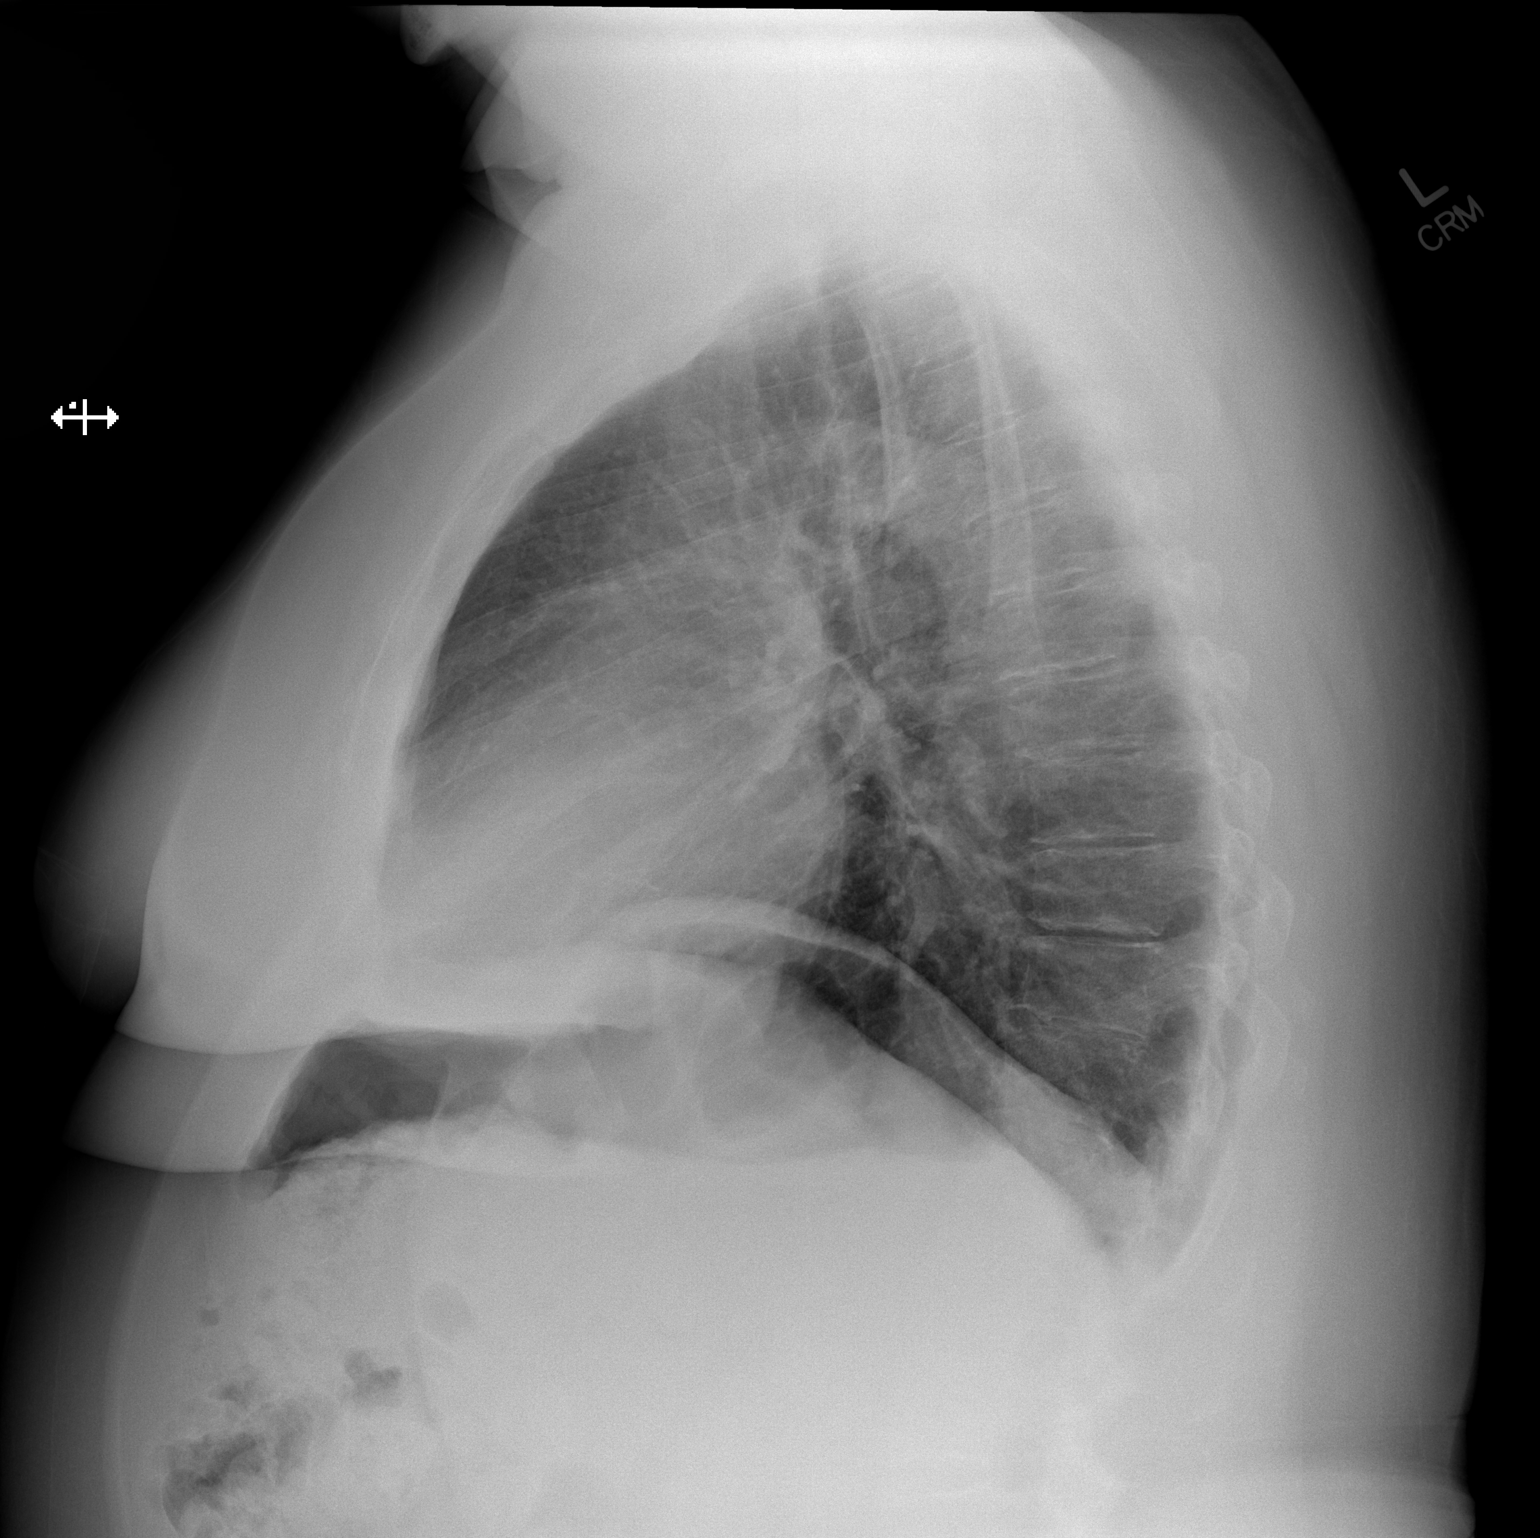

[2 of 2 positions shown; findings below may reference images not displayed]

FINDINGS: The heart size and mediastinal contours are within normal limits.
Both lungs are clear. Slight multilevel disc space narrowing along
the dorsal spine, degenerative in etiology. No acute nor suspicious
osseous abnormality.
IMPRESSION: No active cardiopulmonary disease.

## 2017-12-24 DIAGNOSIS — L821 Other seborrheic keratosis: Secondary | ICD-10-CM | POA: Diagnosis not present

## 2017-12-24 DIAGNOSIS — L820 Inflamed seborrheic keratosis: Secondary | ICD-10-CM | POA: Diagnosis not present

## 2017-12-24 DIAGNOSIS — L810 Postinflammatory hyperpigmentation: Secondary | ICD-10-CM | POA: Diagnosis not present

## 2017-12-24 DIAGNOSIS — L578 Other skin changes due to chronic exposure to nonionizing radiation: Secondary | ICD-10-CM | POA: Diagnosis not present

## 2018-01-02 DIAGNOSIS — Z23 Encounter for immunization: Secondary | ICD-10-CM | POA: Diagnosis not present

## 2018-01-18 NOTE — Progress Notes (Signed)
GYNECOLOGY  VISIT   HPI: 57 y.o.   Married White or Caucasian Not Hispanic or Latino  female   [phone] with Patient's last menstrual period was 11/26/2017.   here for follow up on cyclic provera.  Took Provera April16-20 and had spotting April 23-26. Spotting May 9-12. Took Provera June 15-19, light bleeding June 23, spotting June 24-26. Took Provera August 15-20 and had spotting August 22-24, light bleeding August 25-26, then spotting again August 27. The patient has a h/o endometrial hyperplasia without atypia in 2015. She had no bleeding from 2015 until the spring. In the spring she had an endometrial biopsy that returned with proliferative endometrium, sonohysterogram was negative. Pap and hpv were negative, FSH was 56. She was given a script for cyclic provera and is here to review. She is taking the provera every other month and will notice minimal spotting to light flow for x 4-6 days. Only needs a mini-pad. One episode of random spotting x 4 days in May.  Sexually active, no bleeding after intercourse.   GYNECOLOGIC HISTORY: Patient's last menstrual period was 11/26/2017. Contraception: Post menopausal Menopausal hormone therapy: None        OB History    Gravida  4   Para  2   Term  2   Preterm      AB  2   Living  2     SAB  2   TAB      Ectopic      Multiple      Live Births  2              Patient Active Problem List   Diagnosis Date Noted  . Knee pain 06/01/2017  . Obesity, morbid ([place]) 03/08/2013  . Post-menopausal bleeding 03/08/2013    History reviewed. No pertinent past medical history.  Past Surgical History:  Procedure Laterality Date  . DILATATION & CURETTAGE/HYSTEROSCOPY WITH TRUECLEAR N/A 07/27/2013   Procedure: DILATATION & CURETTAGE/HYSTEROSCOPY WITH TRUCLEAR;  Surgeon: Azalia Bilis, MD;  Location: [facility];  Service: Gynecology;  Laterality: N/A;  . ENDOMETRIAL BIOPSY  2015   neg    Current Outpatient Medications  Medication Sig  Dispense Refill  . medroxyPROGESTERone (PROVERA) 5 MG tablet Take one tablet every other month x 5 days every other month until no bleeding at all for 6 months. 15 tablet 1   No current facility-administered medications for this visit.      ALLERGIES: Patient has no known allergies.  Family History  Problem Relation Age of Onset  . Breast cancer Mother   . Cancer Mother        lung  . Cancer Father        pancreatic  . Heart disease Father   . Diabetes Father   . Cancer Maternal Grandmother   . Cancer Paternal Grandmother        pancreatic    Social History   Socioeconomic History  . Marital status: Married    Spouse name: Not on file  . Number of children: Not on file  . Years of education: Not on file  . Highest education level: Not on file  Occupational History  . Not on file  Social Needs  . Financial resource strain: Not on file  . Food insecurity:    Worry: Not on file    Inability: Not on file  . Transportation needs:    Medical: Not on file    Non-medical: Not on file  Tobacco Use  .  Smoking status: Former Research scientist (life sciences)  . Smokeless tobacco: Never Used  Substance and Sexual Activity  . Alcohol use: No  . Drug use: No  . Sexual activity: Yes    Partners: Male    Comment: husband vasectomy  Lifestyle  . Physical activity:    Days per week: Not on file    Minutes per session: Not on file  . Stress: Not on file  Relationships  . Social connections:    Talks on phone: Not on file    Gets together: Not on file    Attends religious service: Not on file    Active member of club or organization: Not on file    Attends meetings of clubs or organizations: Not on file    Relationship status: Not on file  . Intimate partner violence:    Fear of current or ex partner: Not on file    Emotionally abused: Not on file    Physically abused: Not on file    Forced sexual activity: Not on file  Other Topics Concern  . Not on file  Social History Narrative  . Not on  file    Review of Systems  Constitutional: Negative.   HENT: Negative.   Eyes: Negative.   Respiratory: Negative.   Cardiovascular: Negative.   Gastrointestinal: Negative.   Genitourinary: Negative.   Musculoskeletal: Negative.   Skin: Negative.   Neurological: Negative.   Endo/Heme/Allergies: Negative.   Psychiatric/Behavioral: Negative.     PHYSICAL EXAMINATION:    BP 136/74 (BP Location: Right Arm, Patient Position: Sitting, Cuff Size: Normal)   Pulse 74   Wt 229 lb 9.6 oz (104.1 kg)   LMP 11/26/2017   BMI 43.03 kg/m     General appearance: alert, cooperative and appears stated age  ASSESSMENT PMP bleeding, negative evaluation, proliferative endometrium on biopsy. She has been having spotting to light bleeding every time she takes the provera and one time in between.     PLAN Continue provera every other month If she has any further bleeding in between taking the provera, I will increase the provera to monthly F/U in 1/20 for annual exam She will continue to calendar her provera usage and any bleeding Recommend she stays on provera until she goes 6 months without any bleeding.    An After Visit Summary was printed and given to the patient.   CC: Catherine [name], CNM

## 2018-01-20 ENCOUNTER — Ambulatory Visit: Payer: 59 | Admitting: Obstetrics and Gynecology

## 2018-01-20 ENCOUNTER — Encounter: Payer: Self-pay | Admitting: Obstetrics and Gynecology

## 2018-01-20 ENCOUNTER — Other Ambulatory Visit: Payer: Self-pay

## 2018-01-20 VITALS — BP 136/74 | HR 74 | Wt 229.6 lb

## 2018-01-20 DIAGNOSIS — N950 Postmenopausal bleeding: Secondary | ICD-10-CM | POA: Diagnosis not present

## 2018-01-20 MED ORDER — MEDROXYPROGESTERONE ACETATE 5 MG PO TABS: ORAL_TABLET | ORAL | 1 refills | Status: DC

## 2018-04-02 ENCOUNTER — Encounter: Payer: Self-pay | Admitting: Obstetrics and Gynecology

## 2018-04-02 DIAGNOSIS — Z0000 Encounter for general adult medical examination without abnormal findings: Secondary | ICD-10-CM | POA: Diagnosis not present

## 2018-04-02 DIAGNOSIS — R82998 Other abnormal findings in urine: Secondary | ICD-10-CM | POA: Diagnosis not present

## 2018-04-07 HISTORY — PX: CARPAL TUNNEL RELEASE: SHX101

## 2018-04-09 DIAGNOSIS — Z1389 Encounter for screening for other disorder: Secondary | ICD-10-CM | POA: Diagnosis not present

## 2018-04-09 DIAGNOSIS — G5601 Carpal tunnel syndrome, right upper limb: Secondary | ICD-10-CM | POA: Diagnosis not present

## 2018-04-09 DIAGNOSIS — R7301 Impaired fasting glucose: Secondary | ICD-10-CM | POA: Diagnosis not present

## 2018-04-09 DIAGNOSIS — Z0000 Encounter for general adult medical examination without abnormal findings: Secondary | ICD-10-CM | POA: Diagnosis not present

## 2018-04-09 DIAGNOSIS — R030 Elevated blood-pressure reading, without diagnosis of hypertension: Secondary | ICD-10-CM | POA: Diagnosis not present

## 2018-04-12 ENCOUNTER — Encounter: Payer: Self-pay | Admitting: Obstetrics and Gynecology

## 2018-04-12 DIAGNOSIS — G5603 Carpal tunnel syndrome, bilateral upper limbs: Secondary | ICD-10-CM | POA: Insufficient documentation

## 2018-04-12 DIAGNOSIS — M79641 Pain in right hand: Secondary | ICD-10-CM | POA: Diagnosis not present

## 2018-04-12 DIAGNOSIS — Z1231 Encounter for screening mammogram for malignant neoplasm of breast: Secondary | ICD-10-CM | POA: Diagnosis not present

## 2018-04-12 DIAGNOSIS — M79642 Pain in left hand: Secondary | ICD-10-CM | POA: Diagnosis not present

## 2018-04-15 DIAGNOSIS — Z1212 Encounter for screening for malignant neoplasm of rectum: Secondary | ICD-10-CM | POA: Diagnosis not present

## 2018-04-23 ENCOUNTER — Ambulatory Visit: Payer: 59 | Admitting: Certified Nurse Midwife

## 2018-04-26 ENCOUNTER — Ambulatory Visit: Payer: 59 | Admitting: Obstetrics and Gynecology

## 2018-04-26 DIAGNOSIS — G5603 Carpal tunnel syndrome, bilateral upper limbs: Secondary | ICD-10-CM | POA: Diagnosis not present

## 2018-04-29 DIAGNOSIS — Z13820 Encounter for screening for osteoporosis: Secondary | ICD-10-CM | POA: Diagnosis not present

## 2018-05-03 ENCOUNTER — Ambulatory Visit: Payer: 59 | Admitting: Obstetrics and Gynecology

## 2018-05-03 ENCOUNTER — Telehealth: Payer: Self-pay | Admitting: Obstetrics and Gynecology

## 2018-05-03 NOTE — Telephone Encounter (Signed)
Patient called and cancelled her AEX with Dr. Talbert Nan today due to her father-in-law passed away last night. Patient rescheduled to 05/17/18 at 8:00 AM with Dr. Talbert Nan.

## 2018-05-13 NOTE — Progress Notes (Signed)
58 y.o. [id number] Married White or Caucasian Not Hispanic or Latino female here for annual exam.    The patient has a h/o endometrial hyperplasia without atypia in 2015. She had no bleeding from 2015 until the spring. In the spring she had an endometrial biopsy that returned with proliferative endometrium, sonohysterogram was negative. Pap and hpv were negative, FSH was 56. She was given a script for cyclic provera    She takes provera every other month and has minimal spotting to light flow for 4-7 days. No bleeding in between or after intercourse. No vasomotor symptoms.  No bowel or bladder c/o.   She is going to have carpel tunnel surgery on the left next month. Will need to do the right hand in the future.   Patient's last menstrual period was 04/02/2018.          Sexually active: Yes.    The current method of family planning is vasectomy.    Exercising: No.  The patient does not participate in regular exercise at present. Smoker:  no  Health Maintenance: Pap:  07/08/2017 WNL NEG HPV, 04-11-16 neg HPV HR neg, 02-21-14 neg,  History of Abnormal Pap: yes MMG:  January 2020 per patient with [facility] Colonoscopy:  2018, normal, f/u in 10 years.   BMD:  04/29/18 Normal TDAP: 04/16/2017 Gardasil: N/A   reports that she has quit smoking. She has never used smokeless tobacco. She reports that she does not drink alcohol or use drugs. She does computer work.  Sons are 28 and 24. She has a 32 year old grand daughter, one baby on the way (older sons kids). Younger son is a Therapist, sports in the cardiac ICU and is considering becoming a CNRA.  History reviewed. No pertinent past medical history.  Past Surgical History:  Procedure Laterality Date  . DILATATION & CURETTAGE/HYSTEROSCOPY WITH TRUECLEAR N/A 07/27/2013   Procedure: DILATATION & CURETTAGE/HYSTEROSCOPY WITH TRUCLEAR;  Surgeon: Azalia Bilis, MD;  Location: [facility];  Service: Gynecology;  Laterality: N/A;  . ENDOMETRIAL BIOPSY  2015   neg    Current  Outpatient Medications  Medication Sig Dispense Refill  . medroxyPROGESTERone (PROVERA) 5 MG tablet Take one tablet every other month x 5 days every other month until no bleeding at all for 6 months. (Patient not taking: Reported on 05/17/2018) 15 tablet 1   No current facility-administered medications for this visit.     Family History  Problem Relation Age of Onset  . Breast cancer Mother   . Cancer Mother        lung  . Cancer Father        pancreatic  . Heart disease Father   . Diabetes Father   . Cancer Maternal Grandmother   . Cancer Paternal Grandmother        pancreatic  Mom was ~90 with breast cancer diagnosis.   Review of Systems  Constitutional: Negative.   HENT: Negative.   Eyes: Negative.   Respiratory: Negative.   Cardiovascular: Negative.   Gastrointestinal: Negative.   Endocrine: Negative.   Genitourinary: Negative.   Musculoskeletal: Negative.   Skin: Negative.   Allergic/Immunologic: Negative.   Neurological: Negative.   Hematological: Negative.   Psychiatric/Behavioral: Negative.     Exam:   BP 128/82 (BP Location: Right Arm, Patient Position: Sitting, Cuff Size: Large)   Pulse 84   Resp 14   Ht 5' 1.5" (1.562 m)   Wt 229 lb (103.9 kg)   LMP 04/02/2018   BMI 42.57 kg/m  Weight change: @WEIGHTCHANGE @ Height:   Height: 5' 1.5" (156.2 cm)  Ht Readings from Last 3 Encounters:  05/17/18 5' 1.5" (1.562 m)  04/16/17 5' 1.25" (1.556 m)  04/11/16 5' 1.5" (1.562 m)    General appearance: alert, cooperative and appears stated age Head: Normocephalic, without obvious abnormality, atraumatic Neck: no adenopathy, supple, symmetrical, trachea midline and thyroid normal to inspection and palpation Lungs: clear to auscultation bilaterally Cardiovascular: regular rate and rhythm Breasts: normal appearance, no masses or tenderness Abdomen: soft, non-tender; non distended,  no masses,  no organomegaly Extremities: extremities normal, atraumatic, no cyanosis  or edema Skin: Skin color, texture, turgor normal. No rashes or lesions Lymph nodes: Cervical, supraclavicular, and axillary nodes normal. No abnormal inguinal nodes palpated Neurologic: Grossly normal   Pelvic: External genitalia:  no lesions              Urethra:  normal appearing urethra with no masses, tenderness or lesions              Bartholins and Skenes: normal                 Vagina: normal appearing vagina with normal color and discharge, no lesions              Cervix: no lesions               Bimanual Exam:  Uterus:  normal size, contour, position, consistency, mobility, non-tender              Adnexa: no mass, fullness, tenderness               Rectovaginal: Confirms               Anus:  normal sphincter tone, no lesions  Chaperone was present for exam.  Labs from primary were reviewed and scanned. Normal lipids. HgbA1C was 5.7%  A:  Well Woman with normal exam  AUB, negative evaluation, using cyclic provera with light bleeding.   BMI 42  P:   No pap this year  Labs with primary  Mammogram just done  DEXA normal 1/20  Discussed breast self exam  Discussed calcium and vit D intake  Continue with the cyclic provera, she will call with any bleeding outside of using the provera  Discussed weight loss, offered referral to medical weight loss clinic (declined)

## 2018-05-17 ENCOUNTER — Ambulatory Visit: Payer: 59 | Admitting: Obstetrics and Gynecology

## 2018-05-17 ENCOUNTER — Encounter: Payer: Self-pay | Admitting: Obstetrics and Gynecology

## 2018-05-17 ENCOUNTER — Other Ambulatory Visit: Payer: Self-pay

## 2018-05-17 VITALS — BP 128/82 | HR 84 | Resp 14 | Ht 61.5 in | Wt 229.0 lb

## 2018-05-17 DIAGNOSIS — Z01419 Encounter for gynecological examination (general) (routine) without abnormal findings: Secondary | ICD-10-CM

## 2018-05-17 DIAGNOSIS — N939 Abnormal uterine and vaginal bleeding, unspecified: Secondary | ICD-10-CM

## 2018-05-17 DIAGNOSIS — Z6841 Body Mass Index (BMI) 40.0 and over, adult: Secondary | ICD-10-CM | POA: Diagnosis not present

## 2018-05-17 MED ORDER — MEDROXYPROGESTERONE ACETATE 5 MG PO TABS: ORAL_TABLET | ORAL | 1 refills | Status: DC

## 2018-05-17 NOTE — Patient Instructions (Signed)

## 2018-06-18 DIAGNOSIS — G5602 Carpal tunnel syndrome, left upper limb: Secondary | ICD-10-CM | POA: Diagnosis not present

## 2018-07-02 DIAGNOSIS — M25632 Stiffness of left wrist, not elsewhere classified: Secondary | ICD-10-CM | POA: Diagnosis not present

## 2018-11-19 DIAGNOSIS — M40202 Unspecified kyphosis, cervical region: Secondary | ICD-10-CM | POA: Insufficient documentation

## 2019-04-18 ENCOUNTER — Encounter: Payer: Self-pay | Admitting: Certified Nurse Midwife

## 2019-05-30 NOTE — Progress Notes (Signed)
59 y.o. [id number] Married White or Caucasian Not Hispanic or Latino female here for annual exam.     The patient has a h/o endometrial hyperplasia without atypia in 2015. She had no bleeding from 2015 until the spring of 2019. In the spring of 2019 she had an endometrial biopsy that returned with proliferative endometrium, sonohysterogram was negative. Pap and hpv were negative, FSH was 56. She was given a script for cyclic provera. Last year she reported minimal spotting after the provera.  Patient states that she has not taken her provera in a while. The last time she took it was Oct and she did have a light bleed x 3 days. She was due to take the provera in 12/20 and didn't want to bleed at Christmas.   Sexually active, no pain. No vaginal dryness. No significant vasomotor symptoms (never has).     Since the end of August she has lost ~25 lbs. She is using an app, tracking calories. Walking some.   Patient's last menstrual period was 04/02/2018.          Sexually active: Yes.    The current method of family planning is post menopausal status.    Exercising: No.  The patient does not participate in regular exercise at present. Smoker:  no  Health Maintenance: Pap:  07/08/2017 WNL NEG HPV, 04-11-16 neg HPV HR neg, 02-21-14 neg,  History of abnormal Pap:  yes MMG:  04/18/19 Bi-rads 1 neg  BMD:   2020 with Dr. Dagmar Hait Normal per patient.  Colonoscopy: 2018, normal, f/u in 10 years TDaP:  04/16/2017 Gardasil: NA   reports that she has quit smoking. She has never used smokeless tobacco. She reports that she does not drink alcohol or use drugs. 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 2 grown sons, 2 grand daughters.   History reviewed. No pertinent past medical history.  Past Surgical History:  Procedure Laterality Date  . DILATATION & CURETTAGE/HYSTEROSCOPY WITH TRUECLEAR N/A 07/27/2013   Procedure: DILATATION & CURETTAGE/HYSTEROSCOPY WITH TRUCLEAR;  Surgeon: Azalia Bilis, MD;  Location: [facility];  Service:  Gynecology;  Laterality: N/A;  . ENDOMETRIAL BIOPSY  2015   neg    Current Outpatient Medications  Medication Sig Dispense Refill  . medroxyPROGESTERone (PROVERA) 5 MG tablet Take one tablet every other month x 5 days every other month until no bleeding at all for 6 months. 15 tablet 1   No current facility-administered medications for this visit.    Family History  Problem Relation Age of Onset  . Breast cancer Mother   . Cancer Mother        lung  . Cancer Father        pancreatic  . Heart disease Father   . Diabetes Father   . Cancer Maternal Grandmother   . Cancer Paternal Grandmother        pancreatic  Mom with breast cancer in her late 21's. Died at 42.  Review of Systems  All other systems reviewed and are negative. She reports occasional urge incontinence, 1-2 x a week, small amounts. Worse in the last 6 months. Worse with a full bladder. She drinks ~24 oz of coffee a day.  Exam:   BP 124/68   Pulse 80   Temp 97.9 F (36.6 C)   Ht 5' 1.5" (1.562 m)   Wt 212 lb (96.2 kg)   LMP 04/02/2018   SpO2 94%   BMI 39.41 kg/m   Weight change: @WEIGHTCHANGE @ Height:   Height: 5' 1.5" (  156.2 cm)  Ht Readings from Last 3 Encounters:  06/02/19 5' 1.5" (1.562 m)  05/17/18 5' 1.5" (1.562 m)  04/16/17 5' 1.25" (1.556 m)    General appearance: alert, cooperative and appears stated age Head: Normocephalic, without obvious abnormality, atraumatic Neck: no adenopathy, supple, symmetrical, trachea midline and thyroid normal to inspection and palpation Lungs: clear to auscultation bilaterally Cardiovascular: regular rate and rhythm Breasts: normal appearance, no masses or tenderness Abdomen: soft, non-tender; non distended,  no masses,  no organomegaly Extremities: extremities normal, atraumatic, no cyanosis or edema Skin: Skin color, texture, turgor normal. No rashes or lesions Lymph nodes: Cervical, supraclavicular, and axillary nodes normal. No abnormal inguinal nodes  palpated Neurologic: Grossly normal   Pelvic: External genitalia:  no lesions              Urethra:  normal appearing urethra with no masses, tenderness or lesions              Bartholins and Skenes: normal                 Vagina: normal appearing vagina with normal color and discharge, no lesions. Appears with estrogenized.               Cervix: no lesions               Bimanual Exam:  Uterus:  normal size, contour, position, consistency, mobility, non-tender              Adnexa: no mass, fullness, tenderness               Rectovaginal: Confirms               Anus:  normal sphincter tone, no lesions  Gae Dry chaperoned for the exam.  Reviewed her menstrual calendar. Always bleeds after provera. Ranges from 5-9 days. Can be normal to heavy flow, needs tampons. Can saturate a super tampon in up to 3 hours.   A:  Well Woman with normal exam  Persistent PMP w/d bleeding with provera. Heavy at times. H/O endometrial hyperplasia without atypia.   Urge incontinence  P:   No pap this year  [place]  Repeat u/s, further plans after ultrasound  Discussed breast self exam  Discussed calcium and vit D intake  Mammogram, colonoscopy and DEXA UTD  Labs with primary  Check urine for ua, c&s, do kegel exercises, cut back on caffeine.

## 2019-06-01 ENCOUNTER — Other Ambulatory Visit: Payer: Self-pay

## 2019-06-02 ENCOUNTER — Encounter: Payer: Self-pay | Admitting: Obstetrics and Gynecology

## 2019-06-02 ENCOUNTER — Ambulatory Visit: Payer: 59 | Admitting: Obstetrics and Gynecology

## 2019-06-02 VITALS — BP 124/68 | HR 80 | Temp 97.9°F | Ht 61.5 in | Wt 212.0 lb

## 2019-06-02 DIAGNOSIS — Z8742 Personal history of other diseases of the female genital tract: Secondary | ICD-10-CM

## 2019-06-02 DIAGNOSIS — Z01419 Encounter for gynecological examination (general) (routine) without abnormal findings: Secondary | ICD-10-CM

## 2019-06-02 DIAGNOSIS — N950 Postmenopausal bleeding: Secondary | ICD-10-CM

## 2019-06-02 DIAGNOSIS — N3941 Urge incontinence: Secondary | ICD-10-CM | POA: Diagnosis not present

## 2019-06-02 MED ORDER — MEDROXYPROGESTERONE ACETATE 5 MG PO TABS: ORAL_TABLET | ORAL | 1 refills | Status: DC

## 2019-06-02 NOTE — Patient Instructions (Signed)
EXERCISE AND DIET:  We recommended that you start or continue a regular exercise program for good health. Regular exercise means any activity that makes your heart beat faster and makes you sweat.  We recommend exercising at least 30 minutes per day at least 3 days a week, preferably 4 or 5.  We also recommend a diet low in fat and sugar.  Inactivity, poor dietary choices and obesity can cause diabetes, heart attack, stroke, and kidney damage, among others.    ALCOHOL AND SMOKING:  Women should limit their alcohol intake to no more than 7 drinks/beers/glasses of wine (combined, not each!) per week. Moderation of alcohol intake to this level decreases your risk of breast cancer and liver damage. And of course, no recreational drugs are part of a healthy lifestyle.  And absolutely no smoking or even second hand smoke. Most people know smoking can cause heart and lung diseases, but did you know it also contributes to weakening of your bones? Aging of your skin?  Yellowing of your teeth and nails?  CALCIUM AND VITAMIN D:  Adequate intake of calcium and Vitamin D are recommended.  The recommendations for exact amounts of these supplements seem to change often, but generally speaking 1,200 mg of calcium (between diet and supplement) and 800 units of Vitamin D per day seems prudent. Certain women may benefit from higher intake of Vitamin D.  If you are among these women, your doctor will have told you during your visit.    PAP SMEARS:  Pap smears, to check for cervical cancer or precancers,  have traditionally been done yearly, although recent scientific advances have shown that most women can have pap smears less often.  However, every woman still should have a physical exam from her gynecologist every year. It will include a breast check, inspection of the vulva and vagina to check for abnormal growths or skin changes, a visual exam of the cervix, and then an exam to evaluate the size and shape of the uterus and  ovaries.  And after 60 years of age, a rectal exam is indicated to check for rectal cancers. We will also provide age appropriate advice regarding health maintenance, like when you should have certain vaccines, screening for sexually transmitted diseases, bone density testing, colonoscopy, mammograms, etc.   MAMMOGRAMS:  All women over 40 years old should have a yearly mammogram. Many facilities now offer a "3D" mammogram, which may cost around $50 extra out of pocket. If possible,  we recommend you accept the option to have the 3D mammogram performed.  It both reduces the number of women who will be called back for extra views which then turn out to be normal, and it is better than the routine mammogram at detecting truly abnormal areas.    COLON CANCER SCREENING: Now recommend starting at age 45. At this time colonoscopy is not covered for routine screening until 50. There are take home tests that can be done between 45-49.   COLONOSCOPY:  Colonoscopy to screen for colon cancer is recommended for all women at age 50.  We know, you hate the idea of the prep.  We agree, BUT, having colon cancer and not knowing it is worse!!  Colon cancer so often starts as a polyp that can be seen and removed at colonscopy, which can quite literally save your life!  And if your first colonoscopy is normal and you have no family history of colon cancer, most women don't have to have it again for   10 years.  Once every ten years, you can do something that may end up saving your life, right?  We will be happy to help you get it scheduled when you are ready.  Be sure to check your insurance coverage so you understand how much it will cost.  It may be covered as a preventative service at no cost, but you should check your particular policy.      Breast Self-Awareness Breast self-awareness means being familiar with how your breasts look and feel. It involves checking your breasts regularly and reporting any changes to your  health care provider. Practicing breast self-awareness is important. A change in your breasts can be a sign of a serious medical problem. Being familiar with how your breasts look and feel allows you to find any problems early, when treatment is more likely to be successful. All women should practice breast self-awareness, including women who have had breast implants. How to do a breast self-exam One way to learn what is normal for your breasts and whether your breasts are changing is to do a breast self-exam. To do a breast self-exam: Look for Changes  1. Remove all the clothing above your waist. 2. Stand in front of a mirror in a room with good lighting. 3. Put your hands on your hips. 4. Push your hands firmly downward. 5. Compare your breasts in the mirror. Look for differences between them (asymmetry), such as: ? Differences in shape. ? Differences in size. ? Puckers, dips, and bumps in one breast and not the other. 6. Look at each breast for changes in your skin, such as: ? Redness. ? Scaly areas. 7. Look for changes in your nipples, such as: ? Discharge. ? Bleeding. ? Dimpling. ? Redness. ? A change in position. Feel for Changes Carefully feel your breasts for lumps and changes. It is best to do this while lying on your back on the floor and again while sitting or standing in the shower or tub with soapy water on your skin. Feel each breast in the following way:  Place the arm on the side of the breast you are examining above your head.  Feel your breast with the other hand.  Start in the nipple area and make  inch (2 cm) overlapping circles to feel your breast. Use the pads of your three middle fingers to do this. Apply light pressure, then medium pressure, then firm pressure. The light pressure will allow you to feel the tissue closest to the skin. The medium pressure will allow you to feel the tissue that is a little deeper. The firm pressure will allow you to feel the tissue  close to the ribs.  Continue the overlapping circles, moving downward over the breast until you feel your ribs below your breast.  Move one finger-width toward the center of the body. Continue to use the  inch (2 cm) overlapping circles to feel your breast as you move slowly up toward your collarbone.  Continue the up and down exam using all three pressures until you reach your armpit.  Write Down What You Find  Write down what is normal for each breast and any changes that you find. Keep a written record with breast changes or normal findings for each breast. By writing this information down, you do not need to depend only on memory for size, tenderness, or location. Write down where you are in your menstrual cycle, if you are still menstruating. If you are having trouble noticing differences   in your breasts, do not get discouraged. With time you will become more familiar with the variations in your breasts and more comfortable with the exam. How often should I examine my breasts? Examine your breasts every month. If you are breastfeeding, the best time to examine your breasts is after a feeding or after using a breast pump. If you menstruate, the best time to examine your breasts is 5-7 days after your period is over. During your period, your breasts are lumpier, and it may be more difficult to notice changes. When should I see my health care provider? See your health care provider if you notice:  A change in shape or size of your breasts or nipples.  A change in the skin of your breast or nipples, such as a reddened or scaly area.  Unusual discharge from your nipples.  A lump or thick area that was not there before.  Pain in your breasts.  Anything that concerns you.  Urinary Incontinence  Urinary incontinence refers to a condition in which a person is unable to control where and when to pass urine. A person with this condition will urinate when he or she does not mean to  (involuntarily). What are the causes? This condition may be caused by:  Medicines.  Infections.  Constipation.  Overactive bladder muscles.  Weak bladder muscles.  Weak pelvic floor muscles. These muscles provide support for the bladder, intestine, and, in women, the uterus.  Enlarged prostate in men. The prostate is a gland near the bladder. When it gets too big, it can pinch the urethra. With the urethra blocked, the bladder can weaken and lose the ability to empty properly.  Surgery.  Emotional factors, such as anxiety, stress, or post-traumatic stress disorder (PTSD).  Pelvic organ prolapse. This happens in women when organs shift out of place and into the vagina. This shift can prevent the bladder and urethra from working properly. What increases the risk? The following factors may make you more likely to develop this condition:  Older age.  Obesity and physical inactivity.  Pregnancy and childbirth.  Menopause.  Diseases that affect the nerves or spinal cord (neurological diseases).  Long-term (chronic) coughing. This can increase pressure on the bladder and pelvic floor muscles. What are the signs or symptoms? Symptoms may vary depending on the type of urinary incontinence you have. They include:  A sudden urge to urinate, but passing urine involuntarily before you can get to a bathroom (urge incontinence).  Suddenly passing urine with any activity that forces urine to pass, such as coughing, laughing, exercise, or sneezing (stress incontinence).  Needing to urinate often, but urinating only a small amount, or constantly dribbling urine (overflow incontinence).  Urinating because you cannot get to the bathroom in time due to a physical disability, such as arthritis or injury, or communication and thinking problems, such as Alzheimer disease (functional incontinence). How is this diagnosed? This condition may be diagnosed based on:  Your medical history.  A  physical exam.  Tests, such as: ? Urine tests. ? X-rays of your kidney and bladder. ? Ultrasound. ? CT scan. ? Cystoscopy. In this procedure, a health care provider inserts a tube with a light and camera (cystoscope) through the urethra and into the bladder in order to check for problems. ? Urodynamic testing. These tests assess how well the bladder, urethra, and sphincter can store and release urine. There are different types of urodynamic tests, and they vary depending on what the test is measuring.   To help diagnose your condition, your health care provider may recommend that you keep a log of when you urinate and how much you urinate. How is this treated? Treatment for this condition depends on the type of incontinence that you have and its cause. Treatment may include:  Lifestyle changes, such as: ? Quitting smoking. ? Maintaining a healthy weight. ? Staying active. Try to get 150 minutes of moderate-intensity exercise every week. Ask your health care provider which activities are safe for you. ? Eating a healthy diet.  Avoid high-fat foods, like fried foods.  Avoid refined carbohydrates like white bread and white rice.  Limit how much alcohol and caffeine you drink.  Increase your fiber intake. Foods such as fresh fruits, vegetables, beans, and whole grains are healthy sources of fiber.  Pelvic floor muscle exercises.  Bladder training, such as lengthening the amount of time between bathroom breaks, or using the bathroom at regular intervals.  Using techniques to suppress bladder urges. This can include distraction techniques or controlled breathing exercises.  Medicines to relax the bladder muscles and prevent bladder spasms.  Medicines to help slow or prevent the growth of a man's prostate.  Botox injections. These can help relax the bladder muscles.  Using pulses of electricity to help change bladder reflexes (electrical nerve stimulation).  For women, using a medical  device to prevent urine leaks. This is a small, tampon-like, disposable device that is inserted into the urethra.  Injecting collagen or carbon beads (bulking agents) into the urinary sphincter. These can help thicken tissue and close the bladder opening.  Surgery. Follow these instructions at home: Lifestyle  Limit alcohol and caffeine. These can fill your bladder quickly and irritate it.  Keep yourself clean to help prevent odors and skin damage. Ask your doctor about special skin creams and cleansers that can protect the skin from urine.  Consider wearing pads or adult diapers. Make sure to change them regularly, and always change them right after experiencing incontinence. General instructions  Take over-the-counter and prescription medicines only as told by your health care provider.  Use the bathroom about every 3-4 hours, even if you do not feel the need to urinate. Try to empty your bladder completely every time. After urinating, wait a minute. Then try to urinate again.  Make sure you are in a relaxed position while urinating.  If your incontinence is caused by nerve problems, keep a log of the medicines you take and the times you go to the bathroom.  Keep all follow-up visits as told by your health care provider. This is important. Contact a health care provider if:  You have pain that gets worse.  Your incontinence gets worse. Get help right away if:  You have a fever or chills.  You are unable to urinate.  You have redness in your groin area or down your legs. Summary  Urinary incontinence refers to a condition in which a person is unable to control where and when to pass urine.  This condition may be caused by medicines, infection, weak bladder muscles, weak pelvic floor muscles, enlargement of the prostate (in men), or surgery.  The following factors increase your risk for developing this condition: older age, obesity, pregnancy and childbirth, menopause,  neurological diseases, and chronic coughing.  There are several types of urinary incontinence. They include urge incontinence, stress incontinence, overflow incontinence, and functional incontinence.  This condition is usually treated first with lifestyle and behavioral changes, such as quitting smoking, eating a healthier   diet, and doing regular pelvic floor exercises. Other treatment options include medicines, bulking agents, medical devices, electrical nerve stimulation, or surgery. This information is not intended to replace advice given to you by your health care provider. Make sure you discuss any questions you have with your health care provider. Document Revised: 04/03/2017 Document Reviewed: 07/03/2016 Elsevier Patient Education  2020 Elsevier Inc.   Kegel Exercises  Kegel exercises can help strengthen your pelvic floor muscles. The pelvic floor is a group of muscles that support your rectum, small intestine, and bladder. In females, pelvic floor muscles also help support the womb (uterus). These muscles help you control the flow of urine and stool. Kegel exercises are painless and simple, and they do not require any equipment. Your provider may suggest Kegel exercises to:  Improve bladder and bowel control.  Improve sexual response.  Improve weak pelvic floor muscles after surgery to remove the uterus (hysterectomy) or pregnancy (females).  Improve weak pelvic floor muscles after prostate gland removal or surgery (males). Kegel exercises involve squeezing your pelvic floor muscles, which are the same muscles you squeeze when you try to stop the flow of urine or keep from passing gas. The exercises can be done while sitting, standing, or lying down, but it is best to vary your position. Exercises How to do Kegel exercises: 1. Squeeze your pelvic floor muscles tight. You should feel a tight lift in your rectal area. If you are a female, you should also feel a tightness in your  vaginal area. Keep your stomach, buttocks, and legs relaxed. 2. Hold the muscles tight for up to 10 seconds. 3. Breathe normally. 4. Relax your muscles. 5. Repeat as told by your health care provider. Repeat this exercise daily as told by your health care provider. Continue to do this exercise for at least 4-6 weeks, or for as long as told by your health care provider. You may be referred to a physical therapist who can help you learn more about how to do Kegel exercises. Depending on your condition, your health care provider may recommend:  Varying how long you squeeze your muscles.  Doing several sets of exercises every day.  Doing exercises for several weeks.  Making Kegel exercises a part of your regular exercise routine. This information is not intended to replace advice given to you by your health care provider. Make sure you discuss any questions you have with your health care provider. Document Revised: 11/11/2017 Document Reviewed: 11/11/2017 Elsevier Patient Education  2020 Elsevier Inc.  

## 2019-06-03 LAB — URINALYSIS, MICROSCOPIC ONLY: Casts: NONE SEEN /lpf

## 2019-06-03 LAB — URINE CULTURE: Organism ID, Bacteria: NO GROWTH

## 2019-06-03 LAB — FOLLICLE STIMULATING HORMONE: FSH: 60.7 m[IU]/mL

## 2019-06-07 ENCOUNTER — Ambulatory Visit (INDEPENDENT_AMBULATORY_CARE_PROVIDER_SITE_OTHER): Payer: 59

## 2019-06-07 ENCOUNTER — Ambulatory Visit: Payer: 59 | Admitting: Obstetrics and Gynecology

## 2019-06-07 ENCOUNTER — Other Ambulatory Visit: Payer: Self-pay

## 2019-06-07 ENCOUNTER — Encounter: Payer: Self-pay | Admitting: Obstetrics and Gynecology

## 2019-06-07 VITALS — BP 124/70 | HR 84 | Temp 98.6°F | Ht 61.5 in | Wt 212.0 lb

## 2019-06-07 DIAGNOSIS — Z8742 Personal history of other diseases of the female genital tract: Secondary | ICD-10-CM | POA: Diagnosis not present

## 2019-06-07 DIAGNOSIS — N950 Postmenopausal bleeding: Secondary | ICD-10-CM | POA: Diagnosis not present

## 2019-06-07 NOTE — Progress Notes (Signed)
GYNECOLOGY  VISIT   HPI: 59 y.o.   Married White or Caucasian Not Hispanic or Latino  female   [phone] with Patient's last menstrual period was 04/02/2018.   here for evaluation of continued PMP bleeding.   The patient has a h/o endometrial hyperplasia without atypia in 2015. She had no bleeding from 2015 until the spring of 2019. In the spring of 2019 she had an endometrial biopsy that returned with proliferative endometrium, sonohysterogram was negative. Pap and hpv were negative, FSH was 56. She was treated with cyclic provera, she stopped taking the provera in 10/20 even though she continued to have w/d bleeding.  On review of her menstrual calendar she was bleeding for 5-9 days with normal to heavy flow each time after she took provera.  No provera and no bleeding since October, 2020.    GYNECOLOGIC HISTORY: Patient's last menstrual period was 04/02/2018. Contraception: none  Menopausal hormone therapy: none         OB History    Gravida  4   Para  2   Term  2   Preterm      AB  2   Living  2     SAB  2   TAB      Ectopic      Multiple      Live Births  2              Patient Active Problem List   Diagnosis Date Noted  . Cervical kyphosis 11/19/2018  . Bilateral carpal tunnel syndrome 04/12/2018  . Knee pain 06/01/2017  . Obesity, morbid ([place]) 03/08/2013  . Post-menopausal bleeding 03/08/2013    History reviewed. No pertinent past medical history.  Past Surgical History:  Procedure Laterality Date  . DILATATION & CURETTAGE/HYSTEROSCOPY WITH TRUECLEAR N/A 07/27/2013   Procedure: DILATATION & CURETTAGE/HYSTEROSCOPY WITH TRUCLEAR;  Surgeon: Azalia Bilis, MD;  Location: [facility];  Service: Gynecology;  Laterality: N/A;  . ENDOMETRIAL BIOPSY  2015   neg    Current Outpatient Medications  Medication Sig Dispense Refill  . medroxyPROGESTERone (PROVERA) 5 MG tablet Take one tablet every other month x 5 days every other month until no bleeding at all  for 6 months. (Patient not taking: Reported on 06/07/2019) 15 tablet 1   No current facility-administered medications for this visit.     ALLERGIES: Patient has no known allergies.  Family History  Problem Relation Age of Onset  . Breast cancer Mother   . Cancer Mother        lung  . Cancer Father        pancreatic  . Heart disease Father   . Diabetes Father   . Cancer Maternal Grandmother   . Cancer Paternal Grandmother        pancreatic    Social History   Socioeconomic History  . Marital status: Married    Spouse name: Not on file  . Number of children: Not on file  . Years of education: Not on file  . Highest education level: Not on file  Occupational History  . Not on file  Tobacco Use  . Smoking status: Former Research scientist (life sciences)  . Smokeless tobacco: Never Used  Substance and Sexual Activity  . Alcohol use: No  . Drug use: No  . Sexual activity: Yes    Partners: Male    Comment: husband vasectomy  Other Topics Concern  . Not on file  Social History Narrative  . Not on file  Social Determinants of Health   Financial Resource Strain:   . Difficulty of Paying Living Expenses: Not on file  Food Insecurity:   . Worried About Charity fundraiser in the Last Year: Not on file  . Ran Out of Food in the Last Year: Not on file  Transportation Needs:   . Lack of Transportation (Medical): Not on file  . Lack of Transportation (Non-Medical): Not on file  Physical Activity:   . Days of Exercise per Week: Not on file  . Minutes of Exercise per Session: Not on file  Stress:   . Feeling of Stress : Not on file  Social Connections:   . Frequency of Communication with Friends and Family: Not on file  . Frequency of Social Gatherings with Friends and Family: Not on file  . Attends Religious Services: Not on file  . Active Member of Clubs or Organizations: Not on file  . Attends Archivist Meetings: Not on file  . Marital Status: Not on file  Intimate Partner  Violence:   . Fear of Current or Ex-Partner: Not on file  . Emotionally Abused: Not on file  . Physically Abused: Not on file  . Sexually Abused: Not on file    Review of Systems  All other systems reviewed and are negative.   PHYSICAL EXAMINATION:    BP 124/70   Pulse 84   Temp 98.6 F (37 C)   Ht 5' 1.5" (1.562 m)   Wt 212 lb (96.2 kg)   LMP 04/02/2018   SpO2 96%   BMI 39.41 kg/m     General appearance: alert, cooperative and appears stated age  Ultrasound images reviewed with the patient, no abnormalities. Uniformly thin endometrium. Atrophic appearing ovaries.   ASSESSMENT H/O endometrial hyperplasia in 2015 H/O bleeding in 2019, proliferative endometrium, negative sonohysterogram, negative pap She has been on cyclic provera with w/d bleeding every time, sometimes heavy. No provera since 10/20 Ultrasound today with thin endometrial stripe. [place] 64    PLAN She will restart the cyclic provera Calendar all bleeding F/U in 6 months for review

## 2019-06-27 ENCOUNTER — Telehealth: Payer: Self-pay

## 2019-06-27 ENCOUNTER — Encounter: Payer: Self-pay | Admitting: Certified Nurse Midwife

## 2019-06-27 ENCOUNTER — Encounter: Payer: Self-pay | Admitting: Obstetrics and Gynecology

## 2019-06-27 NOTE — Telephone Encounter (Signed)
Good morning, I do not have a question but Dr. Talbert Nan asked that I let her know how things went after my March dosage of  medroxyPROGESTERone 5 MG tablet. After taking for 5 days (March 3-7), I only noticed a faint twinge of pink when wiping for a couple days (March 11-12). I never needed to use any type of pad. Nothing like I had back in October. We'll see how things go in May when I take again. Thanks   Pt sent above Mychart message.   Routing to Dr Talbert Nan for [place].

## 2019-12-05 ENCOUNTER — Telehealth: Payer: Self-pay

## 2019-12-05 NOTE — Telephone Encounter (Signed)
Left message for pt to return call to triage RN. 

## 2019-12-05 NOTE — Telephone Encounter (Signed)
AEX 05/2019 H/O endometrial hyperplasia in 2015 H/O bleeding in 2019, proliferative endometrium, negative sonohysterogram, negative pap  Spoke with pt. Pt states cancelling appt on 12/07/19 with Dr Talbert Nan due to not bleeding anymore. Pt states took cyclic provera in March and May of 2021 but forgot to take in July. Pt states only had light pink spotting when wiping for 1-2 days in March and May. Pt states having no vaginal bleeding or spotting since May.  Pt had PUS in 06/2019- Ultrasound today with thin endometrial stripe. [place] 26  Advised will review with Dr Talbert Nan and return call with any further recommendations. Pt agreeable.   Routing to Dr Talbert Nan

## 2019-12-05 NOTE — Telephone Encounter (Signed)
Patient called to cancel upcoming office visit for follow up for vaginal bleeding. Patient states she did not wish to reschedule due to feeling better.

## 2019-12-07 ENCOUNTER — Ambulatory Visit: Payer: 59 | Admitting: Obstetrics and Gynecology

## 2019-12-15 NOTE — Telephone Encounter (Signed)
Any bleeding after provera counts as a w/d bleed. Unopposed estrogen stimulation increases her risk of developing endometrial cancer. I would recommend that she continue with the provera every other month until she goes 6 months without a wd bleed.

## 2019-12-15 NOTE — Telephone Encounter (Signed)
Spoke with pt. Pt given update per Dr Talbert Nan. Pt agreeable and will take Provera this month since skipped July and Aug. Pt states had no bleeding in July or Aug. Has 10 pills of Provera Rx left. Will take now in Sept and then again in Nov and will call back in Jan 2022 with update. Pt verbalized understanding.  Encounter closed.

## 2020-05-02 ENCOUNTER — Encounter: Payer: Self-pay | Admitting: Obstetrics and Gynecology

## 2020-06-04 NOTE — Progress Notes (Signed)
60 y.o. [id number] Married White or Caucasian Not Hispanic or Latino female here for annual exam.    She was seen in 3/21 for evaluation of bleeding:   The patient has a h/o endometrial hyperplasia without atypia in 2015. She had no bleeding from 2015 until the springof 2019. In the springof 2019she had an endometrial biopsy that returned with proliferative endometrium, sonohysterogram was negative. Pap and hpv were negative, FSH was 56. She was treated with cyclic provera, she stopped taking the provera in 10/20 even though she continued to have w/d bleeding.  On review of her menstrual calendar she was bleeding for 5-9 days with normal to heavy flow each time after she took provera.  At that visit she reported no provera and no bleeding since October, 2020.   Ultrasound in 3/21 showed a thin endometrial stripe and an [place] was 60. She was restarted on cyclic provera. She has been taking the provera every other month, in the last year she has only had one day of spotting about 5 days after finishing the provera. No pain. No vasomotor symptoms, no vaginal dryness. No dyspareunia.    She has some urinary urgency and some urge incontinence. Leaks a couple of drops ~1x a week, occurs when she is overfull.   Patient's last menstrual period was 04/02/2018.          Sexually active: Yes.    The current method of family planning is post menopausal status.    Exercising: No.  The patient does not participate in regular exercise at present. Smoker:  no  Health Maintenance: Pap:   07/08/2017 WNL NEG HPV,04-11-16 neg HPV HR neg,02-21-14 neg,  History of abnormal Pap:  Yes, f/u was okay.  MMG:  1/22 at [facility], patient was told it was normal. 04/18/19 Bi-rads 1 neg  BMD:   2020 with Dr. Dagmar Hait normal per patient  Colonoscopy: 2018 follow up 10 years TDaP:  04/16/17  Gardasil: n/a    reports that she has quit smoking. She has never used smokeless tobacco. She reports that she does not drink alcohol and does  not use drugs. 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 2 grown sons, 2 grand daughters.   History reviewed. No pertinent past medical history.  Past Surgical History:  Procedure Laterality Date  . DILATATION & CURETTAGE/HYSTEROSCOPY WITH TRUECLEAR N/A 07/27/2013   Procedure: DILATATION & CURETTAGE/HYSTEROSCOPY WITH TRUCLEAR;  Surgeon: Azalia Bilis, MD;  Location: [facility];  Service: Gynecology;  Laterality: N/A;  . ENDOMETRIAL BIOPSY  2015   neg    No current outpatient medications on file.   No current facility-administered medications for this visit.    Family History  Problem Relation Age of Onset  . Breast cancer Mother   . Cancer Mother        lung  . Cancer Father        pancreatic  . Heart disease Father   . Diabetes Father   . Cancer Maternal Grandmother   . Cancer Paternal Grandmother        pancreatic    Review of Systems  All other systems reviewed and are negative.   Exam:   BP 110/64   Pulse 80   Ht 5' 1.5" (1.562 m)   Wt 224 lb 9.6 oz (101.9 kg)   LMP 04/02/2018   SpO2 96%   BMI 41.75 kg/m   Weight change: @WEIGHTCHANGE @ Height:   Height: 5' 1.5" (156.2 cm)  Ht Readings from Last 3 Encounters:  06/06/20 5' 1.5" (  1.562 m)  06/07/19 5' 1.5" (1.562 m)  06/02/19 5' 1.5" (1.562 m)    General appearance: alert, cooperative and appears stated age Head: Normocephalic, without obvious abnormality, atraumatic Neck: no adenopathy, supple, symmetrical, trachea midline and thyroid normal to inspection and palpation Lungs: clear to auscultation bilaterally Cardiovascular: regular rate and rhythm Breasts: normal appearance, no masses or tenderness Abdomen: soft, non-tender; non distended,  no masses,  no organomegaly Extremities: extremities normal, atraumatic, no cyanosis or edema Skin: Skin color, texture, turgor normal. No rashes or lesions Lymph nodes: Cervical, supraclavicular, and axillary nodes normal. No abnormal inguinal nodes palpated Neurologic: Grossly  normal   Pelvic: External genitalia:  no lesions              Urethra:  normal appearing urethra with no masses, tenderness or lesions              Bartholins and Skenes: normal                 Vagina: normal appearing vagina, not atrophic, large grade 2 rectocele with valsalva              Cervix: no lesions               Bimanual Exam:  Uterus:  no masses or tenderness              Adnexa: no mass, fullness, tenderness               Rectovaginal: Confirms               Anus:  normal sphincter tone, no lesions  Gae Dry chaperoned for the exam.  1. Well woman exam Discussed breast self exam Discussed calcium and vit D intake Mammogram and colonoscopy UTD Labs with primary  2. History of endometrial hyperplasia - medroxyPROGESTERone (PROVERA) 5 MG tablet; Take one tablet a day for 5 days every other month until 6 months without any bleeding.  Dispense: 15 tablet; Refill: 1  3. Rectocele On questioning the patient does notice a bulge when she is wiping. Not bothersome. She occasional needs to reduce her rectocele to defecate.  Information given

## 2020-06-06 ENCOUNTER — Ambulatory Visit: Payer: 59 | Admitting: Obstetrics and Gynecology

## 2020-06-06 ENCOUNTER — Other Ambulatory Visit: Payer: Self-pay

## 2020-06-06 ENCOUNTER — Encounter: Payer: Self-pay | Admitting: Obstetrics and Gynecology

## 2020-06-06 VITALS — BP 110/64 | HR 80 | Ht 61.5 in | Wt 224.6 lb

## 2020-06-06 DIAGNOSIS — N816 Rectocele: Secondary | ICD-10-CM | POA: Diagnosis not present

## 2020-06-06 DIAGNOSIS — Z01419 Encounter for gynecological examination (general) (routine) without abnormal findings: Secondary | ICD-10-CM

## 2020-06-06 DIAGNOSIS — Z8742 Personal history of other diseases of the female genital tract: Secondary | ICD-10-CM | POA: Diagnosis not present

## 2020-06-06 MED ORDER — MEDROXYPROGESTERONE ACETATE 5 MG PO TABS: ORAL_TABLET | ORAL | 1 refills | Status: DC

## 2020-06-06 NOTE — Patient Instructions (Signed)

## 2020-06-15 ENCOUNTER — Other Ambulatory Visit: Payer: Self-pay | Admitting: Obstetrics and Gynecology

## 2020-06-15 DIAGNOSIS — Z8742 Personal history of other diseases of the female genital tract: Secondary | ICD-10-CM

## 2020-06-15 NOTE — Telephone Encounter (Signed)
I called the pharmacy to see if they received Rx on 06/06/20. They have Rx however the additional refill was removed on the pharmacy side. The patient picked up the #15 tablet. Pharmacy asked me to send in the refill only. So #15 sent with 0 refills, pharmacist said she will place the refill on hold.

## 2020-11-28 DIAGNOSIS — M79642 Pain in left hand: Secondary | ICD-10-CM | POA: Insufficient documentation

## 2020-11-28 DIAGNOSIS — M79641 Pain in right hand: Secondary | ICD-10-CM | POA: Insufficient documentation

## 2020-11-28 DIAGNOSIS — M19031 Primary osteoarthritis, right wrist: Secondary | ICD-10-CM | POA: Insufficient documentation

## 2021-05-28 NOTE — Progress Notes (Signed)
61 y.o. [id number] Married White or Caucasian Not Hispanic or Latino female here for annual exam.   ? ?The patient has a h/o endometrial hyperplasia without atypia in 2015. She has been treated with cyclic provera. She bleeds some months and not others. Typically it's just spotting, at the end of 04/02/21 it was light bleeding (changed a pad 1 x a day).  ?Ultrasound in 3/21 showed a thin endometrial stripe and an [place] was 60.  ?  ? ?Some urinary urgency, no change. Not leaking.  ? ?Patient's last menstrual period was 04/02/2018.          ?Sexually active: Yes.    ?The current method of family planning is none.    ?Exercising: No.   ?Smoker:  no ? ?Health Maintenance: ?Pap:  07-08-17 normal Neg HPV ?History of abnormal Pap:  yes-follow up ok ?MMG: 05-08-21 normal ?BMD:   2020 normal-Dr.Avva ?Colonoscopy: 2018 normal ?TDaP:  2019 ?Gardasil: N/A ? ? reports that she has quit smoking. She has never used smokeless tobacco. She reports that she does not drink alcohol and does not use drugs.She works in [employer], dispensing. 2 grown sons, 2 grand daughters (2 and 5, sisters).  ? ?Past Medical History:  ?Diagnosis Date  ? Acid reflux   ? ? ?Past Surgical History:  ?Procedure Laterality Date  ? DILATATION & CURETTAGE/HYSTEROSCOPY WITH TRUECLEAR N/A 07/27/2013  ? Procedure: [place];  Surgeon: Azalia Bilis, MD;  Location: [facility];  Service: Gynecology;  Laterality: N/A;  ? ENDOMETRIAL BIOPSY  2015  ? neg  ? ? ?Current Outpatient Medications  ?Medication Sig Dispense Refill  ? medroxyPROGESTERone (PROVERA) 5 MG tablet TAKE 1 TABLET BY MOUTH A DAY FOR 5 DAYS EVERY OTHER MONTH UNTIL 6 MONTHS WITHOUT ANY BLEEDING. 15 tablet 0  ? pantoprazole (PROTONIX) 40 MG tablet Take 40 mg by mouth daily.    ? ?No current facility-administered medications for this visit.  ? ? ?Family History  ?Problem Relation Age of Onset  ? Breast cancer Mother   ? Cancer Mother   ?     lung  ? Cancer Father   ?     pancreatic  ?  Heart disease Father   ? Diabetes Father   ? Cancer Maternal Grandmother   ? Cancer Paternal Grandmother   ?     pancreatic  ? ? ?Review of Systems  ?All other systems reviewed and are negative. ? ?Exam:   ?BP (!) 152/86 (BP Location: Right Arm, Patient Position: Sitting, Cuff Size: Large)   Pulse 82   Ht 5' 0.5" (1.537 m)   Wt 230 lb (104.3 kg)   LMP 04/02/2018   SpO2 98%   BMI 44.18 kg/m?   Weight change: @WEIGHTCHANGE @ Height:   Height: 5' 0.5" (153.7 cm)  ?Ht Readings from Last 3 Encounters:  ?06/07/21 5' 0.5" (1.537 m)  ?06/06/20 5' 1.5" (1.562 m)  ?06/07/19 5' 1.5" (1.562 m)  ? ? ?General appearance: alert, cooperative and appears stated age ?Head: Normocephalic, without obvious abnormality, atraumatic ?Neck: no adenopathy, supple, symmetrical, trachea midline and thyroid normal to inspection and palpation ?Lungs: clear to auscultation bilaterally ?Cardiovascular: regular rate and rhythm ?Breasts: normal appearance, no masses or tenderness ?Abdomen: soft, non-tender; non distended,  no masses,  no organomegaly ?Extremities: extremities normal, atraumatic, no cyanosis or edema ?Skin: Skin color, texture, turgor normal. No rashes or lesions ?Lymph nodes: Cervical, supraclavicular, and axillary nodes normal. ?No abnormal inguinal nodes palpated ?Neurologic: Grossly normal ? ? ?  Pelvic: External genitalia:  no lesions ?             Urethra:  normal appearing urethra with no masses, tenderness or lesions ?             Bartholins and Skenes: normal    ?             Vagina: normal appearing vagina with normal color and discharge, no lesions, grade 2 rectocele ?             Cervix: no lesions ?              ?Bimanual Exam:  Uterus:   no masses or tenderness ?             Adnexa: no mass, fullness, tenderness ?              Rectovaginal: Confirms ?              Anus:  normal sphincter tone, no lesions ? ?The risks of endometrial biopsy were reviewed and a consent was obtained.  ?A speculum was placed in the vagina  and the cervix was cleansed with betadine. A tenaculum was placed on the cervix and the pipelle was placed into the endometrial cavity. The uterus sounded to ~7 cm. The endometrial biopsy was performed, taking care to get a representative sample, sampling 360 degrees of the uterine cavity. Minimal tissue was obtained. The tenaculum and speculum were removed. There were no complications.  ? ? ?Caryn Bee, CMA chaperoned for the exam. ? ? ?1. Well woman exam ?Discussed breast self exam ?Discussed calcium and vit D intake ?Mammogram and colonoscopy UTD ?Labs with primary ? ?2. History of endometrial hyperplasia ?Continues to have w/d to provera ?- Surgical pathology( [place]/ [facility]) ?-We discussed possible mirena IUD for endometrial protection. Further plans after her biopsy results return. ? ?3. Postmenopausal bleeding ?- Surgical pathology( [place]/ [facility]) ? ?4. Rectocele ?Stable ? ?5. Elevated BP without diagnosis of hypertension ?Repeat BP ? ?  ?

## 2021-06-07 ENCOUNTER — Other Ambulatory Visit (HOSPITAL_COMMUNITY)
Admission: RE | Admit: 2021-06-07 | Discharge: 2021-06-07 | Disposition: A | Discharge status: Home / Self Care | Payer: 59 | Source: Ambulatory Visit | Attending: Obstetrics and Gynecology | Admitting: Obstetrics and Gynecology

## 2021-06-07 ENCOUNTER — Encounter: Payer: Self-pay | Admitting: Obstetrics and Gynecology

## 2021-06-07 ENCOUNTER — Other Ambulatory Visit: Payer: Self-pay

## 2021-06-07 ENCOUNTER — Ambulatory Visit (INDEPENDENT_AMBULATORY_CARE_PROVIDER_SITE_OTHER): Payer: 59 | Admitting: Obstetrics and Gynecology

## 2021-06-07 VITALS — BP 142/84 | HR 82 | Ht 60.5 in | Wt 230.0 lb

## 2021-06-07 DIAGNOSIS — N816 Rectocele: Secondary | ICD-10-CM

## 2021-06-07 DIAGNOSIS — R030 Elevated blood-pressure reading, without diagnosis of hypertension: Secondary | ICD-10-CM

## 2021-06-07 DIAGNOSIS — Z01419 Encounter for gynecological examination (general) (routine) without abnormal findings: Secondary | ICD-10-CM | POA: Diagnosis not present

## 2021-06-07 DIAGNOSIS — Z8742 Personal history of other diseases of the female genital tract: Secondary | ICD-10-CM | POA: Diagnosis present

## 2021-06-07 DIAGNOSIS — N950 Postmenopausal bleeding: Secondary | ICD-10-CM | POA: Diagnosis present

## 2021-06-07 NOTE — Patient Instructions (Signed)
Endometrial Biopsy Post-procedure Instructions Cramping is common.  You may take Ibuprofen, Aleve, or Tylenol for the cramping.  This should resolve within 24 hours.   You may have a small amount of spotting.  You should wear a mini pad for the next few days. You may have intercourse in 24 hours. You need to call the office if you have any pelvic pain, fever, heavy bleeding, or foul smelling vaginal discharge. Shower or bathe as normal You will be notified within one week of your biopsy results or we will discuss your results at your follow-up appointment if needed.  EXERCISE   We recommended that you start or continue a regular exercise program for good health. Physical activity is anything that gets your body moving, some is better than none. The CDC recommends 150 minutes per week of Moderate-Intensity Aerobic Activity and 2 or more days of Muscle Strengthening Activity.  Benefits of exercise are limitless: helps weight loss/weight maintenance, improves mood and energy, helps with depression and anxiety, improves sleep, tones and strengthens muscles, improves balance, improves bone density, protects from chronic conditions such as heart disease, high blood pressure and diabetes and so much more. To learn more visit: WhyNotPoker.uy  DIET: Good nutrition starts with a healthy diet of fruits, vegetables, whole grains, and lean protein sources. Drink plenty of water for hydration. Minimize empty calories, sodium, sweets. For more information about dietary recommendations visit: GeekRegister.com.ee and http://schaefer-mitchell.com/  ALCOHOL:  Women should limit their alcohol intake to no more than 7 drinks/beers/glasses of wine (combined, not each!) per week. Moderation of alcohol intake to this level decreases your risk of breast cancer and liver damage.  If you are concerned that you may have a problem, or your friends  have told you they are concerned about your drinking, there are many resources to help. A well-known program that is free, effective, and available to all people all over the nation is Alcoholics Anonymous.  Check out this site to learn more: BlockTaxes.se   CALCIUM AND VITAMIN D:  Adequate intake of calcium and Vitamin D are recommended for bone health.  You should be getting between 1000-1200 mg of calcium and 800 units of Vitamin D daily between diet and supplements  PAP SMEARS:  Pap smears, to check for cervical cancer or precancers,  have traditionally been done yearly, scientific advances have shown that most women can have pap smears less often.  However, every woman still should have a physical exam from her gynecologist every year. It will include a breast check, inspection of the vulva and vagina to check for abnormal growths or skin changes, a visual exam of the cervix, and then an exam to evaluate the size and shape of the uterus and ovaries. We will also provide age appropriate advice regarding health maintenance, like when you should have certain vaccines, screening for sexually transmitted diseases, bone density testing, colonoscopy, mammograms, etc.   MAMMOGRAMS:  All women over 67 years old should have a routine mammogram.   COLON CANCER SCREENING: Now recommend starting at age 55. At this time colonoscopy is not covered for routine screening until 50. There are take home tests that can be done between 45-49.   COLONOSCOPY:  Colonoscopy to screen for colon cancer is recommended for all women at age 47.  We know, you hate the idea of the prep.  We agree, BUT, having colon cancer and not knowing it is worse!!  Colon cancer so often starts as a polyp that can be seen  and removed at colonscopy, which can quite literally save your life!  And if your first colonoscopy is normal and you have no family history of colon cancer, most women don't have to have it again for 10 years.  Once every  ten years, you can do something that may end up saving your life, right?  We will be happy to help you get it scheduled when you are ready.  Be sure to check your insurance coverage so you understand how much it will cost.  It may be covered as a preventative service at no cost, but you should check your particular policy.      Breast Self-Awareness Breast self-awareness means being familiar with how your breasts look and feel. It involves checking your breasts regularly and reporting any changes to your health care provider. Practicing breast self-awareness is important. A change in your breasts can be a sign of a serious medical problem. Being familiar with how your breasts look and feel allows you to find any problems early, when treatment is more likely to be successful. All women should practice breast self-awareness, including women who have had breast implants. How to do a breast self-exam One way to learn what is normal for your breasts and whether your breasts are changing is to do a breast self-exam. To do a breast self-exam: Look for Changes  Remove all the clothing above your waist. Stand in front of a mirror in a room with good lighting. Put your hands on your hips. Push your hands firmly downward. Compare your breasts in the mirror. Look for differences between them (asymmetry), such as: Differences in shape. Differences in size. Puckers, dips, and bumps in one breast and not the other. Look at each breast for changes in your skin, such as: Redness. Scaly areas. Look for changes in your nipples, such as: Discharge. Bleeding. Dimpling. Redness. A change in position. Feel for Changes Carefully feel your breasts for lumps and changes. It is best to do this while lying on your back on the floor and again while sitting or standing in the shower or tub with soapy water on your skin. Feel each breast in the following way: Place the arm on the side of the breast you are examining  above your head. Feel your breast with the other hand. Start in the nipple area and make  inch (2 cm) overlapping circles to feel your breast. Use the pads of your three middle fingers to do this. Apply light pressure, then medium pressure, then firm pressure. The light pressure will allow you to feel the tissue closest to the skin. The medium pressure will allow you to feel the tissue that is a little deeper. The firm pressure will allow you to feel the tissue close to the ribs. Continue the overlapping circles, moving downward over the breast until you feel your ribs below your breast. Move one finger-width toward the center of the body. Continue to use the  inch (2 cm) overlapping circles to feel your breast as you move slowly up toward your collarbone. Continue the up and down exam using all three pressures until you reach your armpit.  Write Down What You Find  Write down what is normal for each breast and any changes that you find. Keep a written record with breast changes or normal findings for each breast. By writing this information down, you do not need to depend only on memory for size, tenderness, or location. Write down where you are in your  menstrual cycle, if you are still menstruating. If you are having trouble noticing differences in your breasts, do not get discouraged. With time you will become more familiar with the variations in your breasts and more comfortable with the exam. How often should I examine my breasts? Examine your breasts every month. If you are breastfeeding, the best time to examine your breasts is after a feeding or after using a breast pump. If you menstruate, the best time to examine your breasts is 5-7 days after your period is over. During your period, your breasts are lumpier, and it may be more difficult to notice changes. When should I see my health care provider? See your health care provider if you notice: A change in shape or size of your breasts or  nipples. A change in the skin of your breast or nipples, such as a reddened or scaly area. Unusual discharge from your nipples. A lump or thick area that was not there before. Pain in your breasts. Anything that concerns you.

## 2021-06-11 LAB — SURGICAL PATHOLOGY

## 2021-06-13 ENCOUNTER — Other Ambulatory Visit: Payer: Self-pay

## 2021-06-13 MED ORDER — MEDROXYPROGESTERONE ACETATE 10 MG PO TABS: 10.0000 mg | ORAL_TABLET | Freq: Every day | ORAL | 3 refills | Status: DC

## 2021-07-17 ENCOUNTER — Telehealth: Payer: Self-pay | Admitting: *Deleted

## 2021-07-17 DIAGNOSIS — N950 Postmenopausal bleeding: Secondary | ICD-10-CM

## 2021-07-17 NOTE — Telephone Encounter (Signed)
Patient called started on Provera 10 mg tablet daily in 06/2021. Reports today is day 12 of bleeding, started off spotting, now a flow. Wearing long pad only needing change once per day, passing small clot. Patient asked if she could switched back to Provera 5 mg every other month like she has done before? Patient said she doesn't want to bled everyday.  Please advise  ?

## 2021-07-19 NOTE — Telephone Encounter (Signed)
I'm concerned that she is having this bleeding on provera. I would recommend that she return for another u/s. Please schedule with an MD visit.  ?

## 2021-07-22 NOTE — Telephone Encounter (Deleted)
Dr.Jertson I just want to confirm do you want patient schedule just MD for now or ultrasound with MD visit after? ?

## 2021-07-22 NOTE — Telephone Encounter (Signed)
Patient informed, order placed. Message sent to appointments.  ?

## 2021-07-22 NOTE — Telephone Encounter (Signed)
Patient scheduled on 08/15/21 for pelvic ultrasound and MD visit.  ?

## 2021-08-15 ENCOUNTER — Ambulatory Visit (INDEPENDENT_AMBULATORY_CARE_PROVIDER_SITE_OTHER): Payer: 59

## 2021-08-15 ENCOUNTER — Ambulatory Visit: Payer: 59 | Admitting: Obstetrics and Gynecology

## 2021-08-15 ENCOUNTER — Encounter: Payer: Self-pay | Admitting: Obstetrics and Gynecology

## 2021-08-15 VITALS — BP 122/76 | HR 65 | Ht 61.0 in | Wt 236.0 lb

## 2021-08-15 DIAGNOSIS — N950 Postmenopausal bleeding: Secondary | ICD-10-CM

## 2021-08-15 DIAGNOSIS — Z8742 Personal history of other diseases of the female genital tract: Secondary | ICD-10-CM

## 2021-08-15 NOTE — Progress Notes (Signed)
GYNECOLOGY  VISIT ?  ?HPI: ?61 y.o.   Married White or Caucasian Not Hispanic or Latino  female   ?[id number] with Patient's last menstrual period was 04/02/2018.   ?here for evaluation of vaginal bleeding.  ? ?The patient has a h/o endometrial hyperplasia without atypia in 2015. She has been treated with cyclic provera. She bleeds some months and not others. Typically it's just spotting, at the end of 04/02/21 it was light bleeding (changed a pad 1 x a day).  ?Ultrasound in 3/21 showed a thin endometrial stripe and an Rogers was 60.  ?She had an endometrial biopsy in 3/23 for continued w/d bleeding to provera. The biopsy returned with benign inactive endometrium.  ? ?She went from cyclic to daily provera after that biopsy. She had a lot of spotting for a couple of weeks. Seems to be getting better, now just occasional tinge of pink when she wipes.  ?  ?GYNECOLOGIC HISTORY: ?Patient's last menstrual period was 04/02/2018. ?Contraception: pmp  ?Menopausal hormone therapy: none  ?       ?OB History   ? ? Gravida  ?4  ? Para  ?2  ? Term  ?2  ? Preterm  ?   ? AB  ?2  ? Living  ?2  ?  ? ? SAB  ?2  ? IAB  ?   ? Ectopic  ?   ? Multiple  ?   ? Live Births  ?2  ?   ?  ?  ?    ? ?Patient Active Problem List  ? Diagnosis Date Noted  ? Pain in right hand 11/28/2020  ? Pain of left hand 11/28/2020  ? Arthritis of right wrist 11/28/2020  ? Cervical kyphosis 11/19/2018  ? Bilateral carpal tunnel syndrome 04/12/2018  ? Gastro-esophageal reflux disease without esophagitis 09/15/2017  ? Knee pain 06/01/2017  ? Impaired fasting glucose 03/17/2017  ? Family history of malignant neoplasm of trachea, bronchus and lung 02/23/2015  ? Obesity, morbid ([place]) 03/08/2013  ? Post-menopausal bleeding 03/08/2013  ? Obstructive sleep apnea syndrome 05/25/2009  ? Seasonal allergic rhinitis 05/25/2009  ? ? ?Past Medical History:  ?Diagnosis Date  ? Acid reflux   ? ? ?Past Surgical History:  ?Procedure Laterality Date  ? DILATATION & CURETTAGE/HYSTEROSCOPY  WITH TRUECLEAR N/A 07/27/2013  ? Procedure: [place];  Surgeon: Azalia Bilis, MD;  Location: [facility];  Service: Gynecology;  Laterality: N/A;  ? ENDOMETRIAL BIOPSY  2015  ? neg  ? ? ?Current Outpatient Medications  ?Medication Sig Dispense Refill  ? medroxyPROGESTERone (PROVERA) 10 MG tablet Take 1 tablet (10 mg total) by mouth daily. 90 tablet 3  ? pantoprazole (PROTONIX) 40 MG tablet Take 40 mg by mouth daily.    ? ?No current facility-administered medications for this visit.  ?  ? ?ALLERGIES: Patient has no known allergies. ? ?Family History  ?Problem Relation Age of Onset  ? Breast cancer Mother   ? Cancer Mother   ?     lung  ? Cancer Father   ?     pancreatic  ? Heart disease Father   ? Diabetes Father   ? Cancer Maternal Grandmother   ? Cancer Paternal Grandmother   ?     pancreatic  ? ? ?Social History  ? ?Socioeconomic History  ? Marital status: Married  ?  Spouse name: Not on file  ? Number of children: Not on file  ? Years of education: Not on file  ?  Highest education level: Not on file  ?Occupational History  ? Not on file  ?Tobacco Use  ? Smoking status: Former  ? Smokeless tobacco: Never  ?Vaping Use  ? Vaping Use: Never used  ?Substance and Sexual Activity  ? Alcohol use: No  ? Drug use: No  ? Sexual activity: Yes  ?  Partners: Male  ?  Comment: husband vasectomy  ?Other Topics Concern  ? Not on file  ?Social History Narrative  ? Not on file  ? ?Social Determinants of Health  ? ?Financial Resource Strain: Not on file  ?Food Insecurity: Not on file  ?Transportation Needs: Not on file  ?Physical Activity: Not on file  ?Stress: Not on file  ?Social Connections: Not on file  ?Intimate Partner Violence: Not on file  ? ? ?Review of Systems  ?All other systems reviewed and are negative. ? ?PHYSICAL EXAMINATION:   ? ?BP 122/76   Pulse 65   Ht '5\' 1"'$  (1.549 m)   Wt 236 lb (107 kg)   LMP 04/02/2018   SpO2 99%   BMI 44.59 kg/m?     ?General appearance: alert,  cooperative and appears stated age ? ?Pelvic ultrasound ? ?Indications: PMP spotting ? ?Findings: ? ?Uterus 7.42 x 4.62 x 4.93 cm, anteverted ? ?Endometrium 2.7 mm, thin, symmetrical, no thickening ? ?Left ovary 2.73 x 1.72 x 1.45 cm ? ?Right ovary 3.71 x 1.50 x 1.39 cm ? ?No free fluid ? ?Impression:  ?Normal sized anteverted uterus ?Thin, symmetrical endometrium ?Atrophic appearing ovaries bilaterally ? ?1. PMB (postmenopausal bleeding) ?Inactive endometrium on biopsy ?Thin stripe on ultrasound ?Spotting has improved, may have been the adjustment from cyclic to daily provera ?Continue on daily provera ? ?2. History of endometrial hyperplasia ?On daily provera ? ?

## 2022-01-29 ENCOUNTER — Ambulatory Visit: Payer: 59 | Admitting: Dermatology

## 2022-01-29 DIAGNOSIS — L918 Other hypertrophic disorders of the skin: Secondary | ICD-10-CM | POA: Diagnosis not present

## 2022-01-29 DIAGNOSIS — L814 Other melanin hyperpigmentation: Secondary | ICD-10-CM

## 2022-01-29 DIAGNOSIS — Z1283 Encounter for screening for malignant neoplasm of skin: Secondary | ICD-10-CM | POA: Diagnosis not present

## 2022-01-29 DIAGNOSIS — L821 Other seborrheic keratosis: Secondary | ICD-10-CM | POA: Diagnosis not present

## 2022-01-29 DIAGNOSIS — L820 Inflamed seborrheic keratosis: Secondary | ICD-10-CM

## 2022-01-29 DIAGNOSIS — L578 Other skin changes due to chronic exposure to nonionizing radiation: Secondary | ICD-10-CM

## 2022-01-29 DIAGNOSIS — D229 Melanocytic nevi, unspecified: Secondary | ICD-10-CM

## 2022-01-29 DIAGNOSIS — Z808 Family history of malignant neoplasm of other organs or systems: Secondary | ICD-10-CM

## 2022-01-29 NOTE — Patient Instructions (Addendum)
Cryotherapy Aftercare  Wash gently with soap and water everyday.   Apply Vaseline and Band-Aid daily until healed.     Due to recent changes in healthcare laws, you may see results of your pathology and/or laboratory studies on MyChart before the doctors have had a chance to review them. We understand that in some cases there may be results that are confusing or concerning to you. Please understand that not all results are received at the same time and often the doctors may need to interpret multiple results in order to provide you with the best plan of care or course of treatment. Therefore, we ask that you please give us 2 business days to thoroughly review all your results before contacting the office for clarification. Should we see a critical lab result, you will be contacted sooner.   If You Need Anything After Your Visit  If you have any questions or concerns for your doctor, please call our main line at [phone] and press option 4 to reach your doctor's medical assistant. If no one answers, please leave a voicemail as directed and we will return your call as soon as possible. Messages left after 4 pm will be answered the following business day.   You may also send us a message via MyChart. We typically respond to MyChart messages within 1-2 business days.  For prescription refills, please ask your pharmacy to contact our office. Our fax number is [phone].  If you have an urgent issue when the clinic is closed that cannot wait until the next business day, you can page your doctor at the number below.    Please note that while we do our best to be available for urgent issues outside of office hours, we are not available 24/7.   If you have an urgent issue and are unable to reach us, you may choose to seek medical care at your doctor's office, retail clinic, urgent care center, or emergency room.  If you have a medical emergency, please immediately call 911 or go to the  emergency department.  Pager Numbers  - Dr. Kowalski: [phone]  - Dr. Moye: [phone]  - Dr. Stewart: [phone]  In the event of inclement weather, please call our main line at [phone] for an update on the status of any delays or closures.  Dermatology Medication Tips: Please keep the boxes that topical medications come in in order to help keep track of the instructions about where and how to use these. Pharmacies typically print the medication instructions only on the boxes and not directly on the medication tubes.   If your medication is too expensive, please contact our office at [phone] option 4 or send us a message through MyChart.   We are unable to tell what your co-pay for medications will be in advance as this is different depending on your insurance coverage. However, we may be able to find a substitute medication at lower cost or fill out paperwork to get insurance to cover a needed medication.   If a prior authorization is required to get your medication covered by your insurance company, please allow us 1-2 business days to complete this process.  Drug prices often vary depending on where the prescription is filled and some pharmacies may offer cheaper prices.  The website www.goodrx.com contains coupons for medications through different pharmacies. The prices here do not account for what the cost may be with help from insurance (it may be cheaper with your insurance), but the website can   give you the price if you did not use any insurance.  - You can print the associated coupon and take it with your prescription to the pharmacy.  - You may also stop by our office during regular business hours and pick up a GoodRx coupon card.  - If you need your prescription sent electronically to a different pharmacy, notify our office through [facility] MyChart or by phone at [phone] option 4.     Si Usted Necesita Algo Despus de Su Visita  Tambin puede  enviarnos un mensaje a travs de MyChart. Por lo general respondemos a los mensajes de MyChart en el transcurso de 1 a 2 das hbiles.  Para renovar recetas, por favor pida a su farmacia que se ponga en contacto con nuestra oficina. Nuestro nmero de fax es el [phone].  Si tiene un asunto urgente cuando la clnica est cerrada y que no puede esperar hasta el siguiente da hbil, puede llamar/localizar a su doctor(a) al nmero que aparece a continuacin.   Por favor, tenga en cuenta que aunque hacemos todo lo posible para estar disponibles para asuntos urgentes fuera del horario de oficina, no estamos disponibles las 24 horas del da, los 7 das de la semana.   Si tiene un problema urgente y no puede comunicarse con nosotros, puede optar por buscar atencin mdica  en el consultorio de su doctor(a), en una clnica privada, en un centro de atencin urgente o en una sala de emergencias.  Si tiene una emergencia mdica, por favor llame inmediatamente al 911 o vaya a la sala de emergencias.  Nmeros de bper  - Dr. Kowalski: [phone]  - Dra. Moye: [phone]  - Dra. Stewart: [phone]  En caso de inclemencias del tiempo, por favor llame a nuestra lnea principal al [phone] para una actualizacin sobre el estado de cualquier retraso o cierre.  Consejos para la medicacin en dermatologa: Por favor, guarde las cajas en las que vienen los medicamentos de uso tpico para ayudarle a seguir las instrucciones sobre dnde y cmo usarlos. Las farmacias generalmente imprimen las instrucciones del medicamento slo en las cajas y no directamente en los tubos del medicamento.   Si su medicamento es muy caro, por favor, pngase en contacto con nuestra oficina llamando al [phone] y presione la opcin 4 o envenos un mensaje a travs de MyChart.   No podemos decirle cul ser su copago por los medicamentos por adelantado ya que esto es diferente dependiendo de la cobertura de su seguro.  Sin embargo, es posible que podamos encontrar un medicamento sustituto a menor costo o llenar un formulario para que el seguro cubra el medicamento que se considera necesario.   Si se requiere una autorizacin previa para que su compaa de seguros cubra su medicamento, por favor permtanos de 1 a 2 das hbiles para completar este proceso.  Los precios de los medicamentos varan con frecuencia dependiendo del lugar de dnde se surte la receta y alguna farmacias pueden ofrecer precios ms baratos.  El sitio web www.goodrx.com tiene cupones para medicamentos de diferentes farmacias. Los precios aqu no tienen en cuenta lo que podra costar con la ayuda del seguro (puede ser ms barato con su seguro), pero el sitio web puede darle el precio si no utiliz ningn seguro.  - Puede imprimir el cupn correspondiente y llevarlo con su receta a la farmacia.  - Tambin puede pasar por nuestra oficina durante el horario de atencin regular y recoger una tarjeta de cupones de GoodRx.  -   Si necesita que su receta se enve electrnicamente a una farmacia diferente, informe a nuestra oficina a travs de MyChart de [facility] o por telfono llamando al [phone] y presione la opcin 4.  

## 2022-01-29 NOTE — Progress Notes (Signed)
New Patient Visit  Subjective  Catherine [name] is a 61 y.o. female who presents for the following: Total body skin exam (Check mole L groin), skin tags (R side, R axilla, L thigh, while irritating), and check spot (R nasal bridge/corner of eye, 86m irritating). The patient presents for Total-Body Skin Exam (TBSE) for skin cancer screening and mole check.  The patient has spots, moles and lesions to be evaluated, some may be new or changing and the patient has concerns that these could be cancer.   The following portions of the chart were reviewed this encounter and updated as appropriate:   Tobacco  Allergies  Meds  Problems  Med Hx  Surg Hx  Fam Hx     Review of Systems:  No other skin or systemic complaints except as noted in HPI or Assessment and Plan.  Objective  Well appearing patient in no apparent distress; mood and affect are within normal limits.  A full examination was performed including scalp, head, eyes, ears, nose, lips, neck, chest, axillae, abdomen, back, buttocks, bilateral upper extremities, bilateral lower extremities, hands, feet, fingers, toes, fingernails, and toenails. All findings within normal limits unless otherwise noted below.  L axilla, R axilla, L upper thigh x 8 (8) Fleshy, skin-colored pedunculated papules.    R nasal bridge x 1, L infraorbital x 1, R post thigh x 1, L pubic x 1, L lower eyelid x 1 (4) Stuck on waxy paps with erythema   Assessment & Plan   Lentigines - Scattered tan macules - Due to sun exposure - Benign-appearing, observe - Recommend daily broad spectrum sunscreen SPF 30+ to sun-exposed areas, reapply every 2 hours as needed. - Call for any changes  Seborrheic Keratoses - Stuck-on, waxy, tan-brown papules and/or plaques  - Benign-appearing - Discussed benign etiology and prognosis. - Observe - Call for any changes  Melanocytic Nevi - Tan-brown and/or pink-flesh-colored symmetric macules and papules - Benign  appearing on exam today - Observation - Call clinic for new or changing moles - Recommend daily use of broad spectrum spf 30+ sunscreen to sun-exposed areas.   Hemangiomas - Red papules - Discussed benign nature - Observe - Call for any changes  Actinic Damage - Chronic condition, secondary to cumulative UV/sun exposure - diffuse scaly erythematous macules with underlying dyspigmentation - Recommend daily broad spectrum sunscreen SPF 30+ to sun-exposed areas, reapply every 2 hours as needed.  - Staying in the shade or wearing long sleeves, sun glasses (UVA+UVB protection) and wide brim hats (4-inch brim around the entire circumference of the hat) are also recommended for sun protection.  - Call for new or changing lesions.  Skin cancer screening performed today.   Family history of skin cancer - what type(s): pt unsure what kind - who affected: Mother  Skin tag (8) L axilla, R axilla, L upper thigh x 8 Snip excision to L axilla x 2, R axilla x 2, L uppr thigh x 1, total of 5 LN2 - L axilla x 2, R axilla x 1, total of 3 Discussed not typically covered by insurance.  Advised pt that cosmetic fee is $115 for up to 15.  Advised if we file insurance and it is not covered pt will be responsible for regular billing fee and not cosmetic fee.  Pt understands, but Pt wants to file with insurance.  Epidermal / dermal shaving - L axilla, R axilla, L upper thigh x 8  Informed consent: discussed and consent obtained  Patient was prepped and draped in usual sterile fashion: area prepped with alcohol. Anesthesia: the lesion was anesthetized in a standard fashion   Anesthetic:  1% lidocaine w/ epinephrine 1-100,000 buffered w/ 8.4% NaHCO3 Instrument used: scissors   Instrument used comment:  Snip excision Hemostasis achieved with: pressure, aluminum chloride and electrodesiccation   Outcome: patient tolerated procedure well   Post-procedure details: wound care instructions given    Post-procedure details comment:  Ointment and bandaid applied Additional details:  X 5 bil axilla, L upper thigh  Destruction of lesion - L axilla, R axilla, L upper thigh x 8 Complexity: simple   Destruction method: cryotherapy   Informed consent: discussed and consent obtained   Timeout:  patient name, date of birth, surgical site, and procedure verified Lesion destroyed using liquid nitrogen: Yes   Region frozen until ice ball extended beyond lesion: Yes   Outcome: patient tolerated procedure well with no complications   Post-procedure details: wound care instructions given   Additional details:  X 3 bil axilla  Inflamed seborrheic keratosis (4) R nasal bridge x 1, L infraorbital x 1, R post thigh x 1, L pubic x 1, L lower eyelid margin x 1 Symptomatic, irritating, patient would like treated. Destruction of lesion - R nasal bridge x 1, L infraorbital x 1, R post thigh x 1, L pubic x 1, L lower eyelid x 1 Complexity: simple   Destruction method: cryotherapy   Informed consent: discussed and consent obtained   Timeout:  patient name, date of birth, surgical site, and procedure verified Lesion destroyed using liquid nitrogen: Yes   Region frozen until ice ball extended beyond lesion: Yes   Outcome: patient tolerated procedure well with no complications   Post-procedure details: wound care instructions given    Return in about 3 years (around 01/29/2025) for TBSE.  I, Othelia Pulling, RMA, am acting as scribe for Sarina Ser, MD . Documentation: I have reviewed the above documentation for accuracy and completeness, and I agree with the above.  Sarina Ser, MD

## 2022-02-03 ENCOUNTER — Encounter: Payer: Self-pay | Admitting: Dermatology

## 2022-04-11 ENCOUNTER — Other Ambulatory Visit: Payer: Self-pay

## 2022-04-11 NOTE — Telephone Encounter (Signed)
JJ pt calling to report that pharmacy is saying that she doesn't have any refills left on her provera.   Last AEX 06/07/2021--scheduled for 06/11/2022. Last mammo-05/02/2020-birads 1 neg (last report seen in EMR)  Spoke with pharmacy and they reported that she has a refill ready. However, doesn't have any more refills after that one is picked up.   Pt notified and voiced understanding.

## 2022-06-02 NOTE — Progress Notes (Signed)
62 y.o. [id number] Married White or Caucasian Not Hispanic or Latino female here for annual exam.  Patient states that she has been taking the provera. She says that since the last week of February she has been spotting. Prior to that it was intermittent spotting.    The patient has a h/o endometrial hyperplasia without atypia in 2015. She had been treated with cyclic provera, then daily provera.She had an endometrial biopsy in 3/23 for continued w/d bleeding to provera. The biopsy returned with benign inactive endometrium. U/S with a thin endometrial stripe in 5/23.   No dyspareunia.   In the last year she has some urinary urgency, some urge incontinence. Leaks small amounts. Normal urinalysis with her primary.   Patient's last menstrual period was 04/02/2018.          Sexually active: Yes.    The current method of family planning is post menopausal status.    Exercising: No.  The patient does not participate in regular exercise at present. Smoker:  no  Health Maintenance: Pap:   07-08-17 normal Neg HPV  History of abnormal Pap:  yes-follow up ok  MMG:  [facility] earlier this [facility], normal per patient. BMD:  2020 normal-Dr.Avva Colonoscopy: 2018 normal, f/u 10 years.  TDaP:  2019 Gardasil: N/A   reports that she has quit smoking. She has never used smokeless tobacco. She reports that she does not drink alcohol and does not use drugs. No ETOH. She retired in 1/24. 2 grown sons, 2 grand daughters (53 and 64, sisters). Youngest son is expecting a baby boy in 7/24. Other son and his wife are in the process of adopting a child from [place].     Past Medical History:  Diagnosis Date   Acid reflux     Past Surgical History:  Procedure Laterality Date   DILATATION & CURETTAGE/HYSTEROSCOPY WITH TRUECLEAR N/A 07/27/2013   Procedure: DILATATION & CURETTAGE/HYSTEROSCOPY WITH TRUCLEAR;  Surgeon: Azalia Bilis, MD;  Location: [facility];  Service: Gynecology;  Laterality: N/A;   ENDOMETRIAL BIOPSY  2015    neg    Current Outpatient Medications  Medication Sig Dispense Refill   medroxyPROGESTERone (PROVERA) 10 MG tablet Take 1 tablet (10 mg total) by mouth daily. 90 tablet 3   pantoprazole (PROTONIX) 40 MG tablet Take 40 mg by mouth daily.     WEGOVY 0.25 MG/0.5ML SOAJ Inject 0.25 mg into the skin.     No current facility-administered medications for this visit.    Family History  Problem Relation Age of Onset   Breast cancer Mother    Cancer Mother        lung   Cancer Father        pancreatic   Heart disease Father    Diabetes Father    Cancer Maternal Grandmother    Cancer Paternal Grandmother        pancreatic    Review of Systems  Genitourinary:  Positive for vaginal bleeding.  All other systems reviewed and are negative.   Exam:   BP 110/74   Pulse 85   Ht '5\' 1"'$  (1.549 m)   Wt 239 lb (108.4 kg)   LMP 04/02/2018   SpO2 100%   BMI 45.16 kg/m   Weight change: '@WEIGHTCHANGE'$ @ Height:   Height: '5\' 1"'$  (154.9 cm)  Ht Readings from Last 3 Encounters:  06/11/22 '5\' 1"'$  (1.549 m)  08/15/21 '5\' 1"'$  (1.549 m)  06/07/21 5' 0.5" (1.537 m)    General appearance: alert, cooperative  and appears stated age Head: Normocephalic, without obvious abnormality, atraumatic Neck: no adenopathy, supple, symmetrical, trachea midline and thyroid normal to inspection and palpation Lungs: clear to auscultation bilaterally Cardiovascular: regular rate and rhythm Breasts: normal appearance, no masses or tenderness Abdomen: soft, non-tender; non distended,  no masses,  no organomegaly Extremities: extremities normal, atraumatic, no cyanosis or edema Skin: Skin color, texture, turgor normal. No rashes or lesions Lymph nodes: Cervical, supraclavicular, and axillary nodes normal. No abnormal inguinal nodes palpated Neurologic: Grossly normal   Pelvic: External genitalia:  no lesions              Urethra:  normal appearing urethra with no masses, tenderness or lesions              Bartholins  and Skenes: normal                 Vagina: normal appearing vagina with normal color, a small amount of blood is seen coming out of the cervix, no lesions. She has a moderate grade 2 rectocele with valsalva.               Cervix: no lesions               Bimanual Exam:  Uterus:   no masses or tenderness              Adnexa: no mass, fullness, tenderness               Rectovaginal: Confirms               Anus:  normal sphincter tone, no lesions  Gae Dry, CMA chaperoned for the exam.  The risks of endometrial biopsy were reviewed and a consent was obtained.  A speculum was placed in the vagina and the cervix was cleansed with betadine. A tenaculum was placed on the cervix and the pipelle was placed into the endometrial cavity. The uterus sounded to ~8 cm. The endometrial biopsy was performed, taking care to get a representative sample, sampling 360 degrees of the uterine cavity. Moderate tissue/blood was obtained. The tenaculum and speculum were removed. There were no complications.   1. Well woman exam Discussed breast self exam Discussed calcium and vit D intake Will get a copy of her mammogram from [facility] Labs from primary will be scanned  2. Screening for cervical cancer - Cytology - PAP  3. PMB (postmenopausal bleeding) - Surgical pathology( [facility]/ [facility]) -She has had continued issues with PMP bleeding on provera. We discussed hysterectomy, she agrees. Once her lab work is back and u/s is done, will place a referral to the GYN Surgeon  4. History of endometrial hyperplasia - Surgical pathology( [facility]/ [facility])  5. Rectocele Not symptomatic

## 2022-06-11 ENCOUNTER — Ambulatory Visit (INDEPENDENT_AMBULATORY_CARE_PROVIDER_SITE_OTHER): Payer: 59 | Admitting: Obstetrics and Gynecology

## 2022-06-11 ENCOUNTER — Other Ambulatory Visit (HOSPITAL_COMMUNITY)
Admission: RE | Admit: 2022-06-11 | Discharge: 2022-06-11 | Disposition: A | Discharge status: Home / Self Care | Payer: 59 | Source: Ambulatory Visit | Attending: Obstetrics and Gynecology | Admitting: Obstetrics and Gynecology

## 2022-06-11 ENCOUNTER — Encounter: Payer: Self-pay | Admitting: Obstetrics and Gynecology

## 2022-06-11 VITALS — BP 110/74 | HR 85 | Ht 61.0 in | Wt 239.0 lb

## 2022-06-11 DIAGNOSIS — Z124 Encounter for screening for malignant neoplasm of cervix: Secondary | ICD-10-CM | POA: Insufficient documentation

## 2022-06-11 DIAGNOSIS — N816 Rectocele: Secondary | ICD-10-CM | POA: Diagnosis not present

## 2022-06-11 DIAGNOSIS — Z8742 Personal history of other diseases of the female genital tract: Secondary | ICD-10-CM | POA: Diagnosis present

## 2022-06-11 DIAGNOSIS — N950 Postmenopausal bleeding: Secondary | ICD-10-CM | POA: Diagnosis present

## 2022-06-11 DIAGNOSIS — Z01419 Encounter for gynecological examination (general) (routine) without abnormal findings: Secondary | ICD-10-CM | POA: Diagnosis not present

## 2022-06-11 NOTE — Patient Instructions (Addendum)
rinary incontinence). Sometimes, symptoms can interfere with work or social activities. Endometrial Biopsy Post-procedure Instructions Cramping is common.  You may take Ibuprofen, Aleve, or Tylenol for the cramping.  This should resolve within 24 hours.   You may have a small amount of spotting.  You should wear a mini pad for the next few days. You may have intercourse in 24 hours. You need to call the office if you have any pelvic pain, fever, heavy bleeding, or foul smelling vaginal discharge. Shower or bathe as normal You will be notified within one week of your biopsy results or we will discuss your results at your follow-up appointment if needed.   Overactive Bladder, Adult  Overactive bladder is a condition in which a person has a sudden and frequent need to urinate. A person might also leak urine if he or she cannot get to the bathroom fast enough [facility] are the causes? Overactive bladder is associated with poor nerve signals between your bladder and your brain. Your bladder may get the signal to empty before it is full. You may also have very sensitive muscles that make your bladder squeeze too soon. This condition may also be caused by other factors, such as: Medical conditions: Urinary tract infection. Infection of nearby tissues. Prostate enlargement. Bladder stones, inflammation, or tumors. Diabetes. Muscle or nerve weakness, especially from these conditions: A spinal cord injury. Stroke. Multiple sclerosis. Parkinson's disease. Other causes: Surgery on the uterus or urethra. Drinking too much caffeine or alcohol. Certain medicines, especially those that eliminate extra fluid in the body (diuretics). Constipation. What increases the risk? You may be at greater risk for overactive bladder if you: Are an older adult. Smoke. Are going through menopause. Have prostate problems. Have a neurological disease, such as stroke, dementia, Parkinson's disease, or multiple  sclerosis (MS). Eat or drink alcohol, spicy food, caffeine, and other things that irritate the bladder. Are overweight or obese. What are the signs or symptoms? Symptoms of this condition include a sudden, strong urge to urinate. Other symptoms include: Leaking urine. Urinating 8 or more times a day. Waking up to urinate 2 or more times overnight. How is this diagnosed? This condition may be diagnosed based on: Your symptoms and medical history. A physical exam. Blood or urine tests to check for possible causes, such as infection. You may also need to see a health care provider who specializes in urinary tract problems. This is called a urologist. How is this treated? Treatment for overactive bladder depends on the cause of your condition and whether it is mild or severe. Treatment may include: Bladder training, such as: Learning to control the urge to urinate by following a schedule to urinate at regular intervals. Doing Kegel exercises to strengthen the pelvic floor muscles that support your bladder. Special devices, such as: Biofeedback. This uses sensors to help you become aware of your body's signals. Electrical stimulation. This uses electrodes placed inside the body (implanted) or outside the body. These electrodes send gentle pulses of electricity to strengthen the nerves or muscles that control the bladder. Women may use a plastic device, called a pessary, that fits into the vagina and supports the bladder. Medicines, such as: Antibiotics to treat bladder infection. Antispasmodics to stop the bladder from releasing urine at the wrong time. Tricyclic antidepressants to relax bladder muscles. Injections of botulinum toxin type A directly into the bladder tissue to relax bladder muscles. Surgery, such as: A device may be implanted to help manage the nerve signals that control  urination. An electrode may be implanted to stimulate electrical signals in the bladder. A procedure may  be done to change the shape of the bladder. This is done only in very severe cases. Follow these instructions at home: Eating and drinking  Make diet or lifestyle changes recommended by your health care provider. These may include: Drinking fluids throughout the day and not only with meals. Cutting down on caffeine or alcohol. Eating a healthy and balanced diet to prevent constipation. This may include: Choosing foods that are high in fiber, such as beans, whole grains, and fresh fruits and vegetables. Limiting foods that are high in fat and processed sugars, such as fried and sweet foods. Lifestyle  Lose weight if needed. Do not use any products that contain nicotine or tobacco. These include cigarettes, chewing tobacco, and vaping devices, such as e-cigarettes. If you need help quitting, ask your health care provider. General instructions Take over-the-counter and prescription medicines only as told by your health care provider. If you were prescribed an antibiotic medicine, take it as told by your health care provider. Do not stop taking the antibiotic even if you start to feel better. Use any implants or pessary as told by your health care provider. If needed, wear pads to absorb urine leakage. Keep a log to track how much and when you drink, and when you need to urinate. This will help your health care provider monitor your condition. Keep all follow-up visits. This is important. Contact a health care provider if: You have a fever or chills. Your symptoms do not get better with treatment. Your pain and discomfort get worse. You have more frequent urges to urinate. Get help right away if: You are not able to control your bladder. Summary Overactive bladder refers to a condition in which a person has a sudden and frequent need to urinate. Several conditions may lead to an overactive bladder. Treatment for overactive bladder depends on the cause and severity of your  condition. Making lifestyle changes, doing Kegel exercises, keeping a log, and taking medicines can help with this condition. This information is not intended to replace advice given to you by your health care provider. Make sure you discuss any questions you have with your health care provider. Document Revised: 12/12/2019 Document Reviewed: 12/12/2019 Elsevier Patient Education  [place].

## 2022-06-12 LAB — CYTOLOGY - PAP
Comment: NEGATIVE
Diagnosis: NEGATIVE
High risk HPV: NEGATIVE

## 2022-06-12 LAB — SURGICAL PATHOLOGY

## 2022-06-16 ENCOUNTER — Ambulatory Visit: Payer: 59 | Admitting: Neurology

## 2022-06-16 ENCOUNTER — Encounter: Payer: Self-pay | Admitting: Neurology

## 2022-06-16 VITALS — BP 152/101 | HR 83 | Ht 61.0 in | Wt 239.0 lb

## 2022-06-16 DIAGNOSIS — G4719 Other hypersomnia: Secondary | ICD-10-CM

## 2022-06-16 DIAGNOSIS — G4733 Obstructive sleep apnea (adult) (pediatric): Secondary | ICD-10-CM | POA: Diagnosis not present

## 2022-06-16 DIAGNOSIS — R351 Nocturia: Secondary | ICD-10-CM | POA: Diagnosis not present

## 2022-06-16 DIAGNOSIS — Z820 Family history of epilepsy and other diseases of the nervous system: Secondary | ICD-10-CM

## 2022-06-16 NOTE — Addendum Note (Signed)
Addended by: Burnice Logan on: 06/16/2022 10:47 AM   Modules accepted: Orders

## 2022-06-16 NOTE — Progress Notes (Signed)
Subjective:    Patient ID: Catherine [name] is a 62 y.o. female.  HPI    Star Age, MD, PhD [facility] [address], [address] [place], [place] [address]  Dear Dr. Dagmar Hait,  I saw your patient, Catherine [name], upon your kind request, in my sleep clinic today for initial consultation of her sleep disorder, in particular, concern for underlying obstructive sleep apnea.  The patient is unaccompanied today.  As you know, Catherine [name] is a 62 year old female with an underlying medical history of allergies, arthritis, reflux disease, carpal tunnel syndrome, arthritis, and morbid obesity with a BMI of over 10, who was previously diagnosed with obstructive sleep apnea and placed on PAP therapy.  She no longer uses her PAP machine.  She reports snoring and excessive daytime somnolence.  Her Epworth sleepiness score is 18 out of 24, fatigue severity score is 47 out of 63.  I reviewed your office note from 05/19/2022.  As she recalls, she had moderate sleep apnea, testing was about 10+ years ago.  She had difficulty tolerating the pressure and the mask at the time, she recalls using a fullface mask and may be trying a different type of machine after the first 1 was not the correct 1.  She reports that her brother has sleep apnea.  She is working on weight loss and started Catherine [name] about a month ago.  She is married and lives with her husband.  She recently retired in January 2024.  They have grown children.  They have no pets in the household.  Bedtime is generally between 10 and 10:30 PM and rise time between 7 and 7:30 AM.  She has nocturia about twice per average night, denies recurrent nocturnal or morning headaches.  She drinks caffeine in the form of coffee, about 2 cups of coffee in the morning and 1 cup of tea during the day.  She has gained weight over time.  They have a TV in the bedroom but it is rarely on at night.  She is a non-smoker and does not currently drink any  alcohol.  She has carpal tunnel release surgery pending for later this week on the right side.  She had the left side operated on about 3 years ago.  Her Past Medical History Is Significant For: Past Medical History:  Diagnosis Date   Acid reflux     Her Past Surgical History Is Significant For: Past Surgical History:  Procedure Laterality Date   CARPAL TUNNEL RELEASE Left 2020   DILATATION & CURETTAGE/HYSTEROSCOPY WITH TRUECLEAR N/A 07/27/2013   Procedure: DILATATION & CURETTAGE/HYSTEROSCOPY WITH TRUCLEAR;  Surgeon: Azalia Bilis, MD;  Location: [facility];  Service: Gynecology;  Laterality: N/A;   ENDOMETRIAL BIOPSY  04/07/2013   neg    Her Family History Is Significant For: Family History  Problem Relation Age of Onset   Breast cancer Mother    Cancer Mother        lung   Cancer Father        pancreatic   Heart disease Father    Diabetes Father    Sleep apnea Brother    Cancer Maternal Grandmother    Cancer Paternal Grandmother        pancreatic    Her Social History Is Significant For: Social History   Socioeconomic History   Marital status: Married    Spouse name: Not on file   Number of children: Not on file   Years of education: Not  on file   Highest education level: Not on file  Occupational History   Not on file  Tobacco Use   Smoking status: Former   Smokeless tobacco: Never   Tobacco comments:    As a teen  Vaping Use   Vaping Use: Never used  Substance and Sexual Activity   Alcohol use: No   Drug use: No   Sexual activity: Yes    Partners: Male    Comment: husband vasectomy  Other Topics Concern   Not on file  Social History Narrative   Caffeine: maybe 3 cups/day (coffee in AM and then maybe tea)   Right handed   Social Determinants of Health   Financial Resource Strain: Not on file  Food Insecurity: Not on file  Transportation Needs: Not on file  Physical Activity: Not on file  Stress: Not on file  Social Connections: Not on file     Her Allergies Are:  No Known Allergies:   Her Current Medications Are:  Outpatient Encounter Medications as of 06/16/2022  Medication Sig   medroxyPROGESTERone (PROVERA) 10 MG tablet Take 1 tablet (10 mg total) by mouth daily.   pantoprazole (PROTONIX) 40 MG tablet Take 40 mg by mouth daily.   WEGOVY 0.25 MG/0.5ML SOAJ Inject 0.25 mg into the skin once a week.   No facility-administered encounter medications on file as of 06/16/2022.  :   Review of Systems:  Out of a complete 14 point review of systems, all are reviewed and negative with the exception of these symptoms as listed below:   Review of Systems  Neurological:        Patient is here alone for sleep consult. She states that about 10 years ago she had an in-lab sleep study completed but does not recall the name of the facility. She believes the study concluded that she had sleep apnea but she did not tolerate the CPAP. She states she had been given the wrong prescription but even after it was corrected she still could not stand it. She states her husband tells she snores. She has trouble staying asleep. She wakes at night to use the restroom about 1-2 times per night. ESS 14. Of note, will soon be having carpal tunnel surgery on the right side.    Objective:  Neurological Exam  Physical Exam Physical Examination:   Vitals:   06/16/22 1232  BP: (!) 149/97  Pulse: 89    General Examination: The patient is a very pleasant 62 y.o. female in no acute distress. She appears well-developed and well-nourished and well groomed.   HEENT: Normocephalic, atraumatic, pupils are equal, round and reactive to light, extraocular tracking is good without limitation to gaze excursion or nystagmus noted. Hearing is grossly intact. Face is symmetric with normal facial animation. Speech is clear with no dysarthria noted. There is no hypophonia. There is no lip, neck/head, jaw or voice tremor. Neck is supple with full range of passive and  active motion. There are no carotid bruits on auscultation. Oropharynx exam reveals: mild mouth dryness, adequate dental hygiene and moderate airway crowding, due to smaller airway entry, slightly wider uvula, tonsils of about 1+ bilaterally. Mallampati is class II. Tongue protrudes centrally and palate elevates symmetrically. Neck size is 17 1/4 inches. She has a Mild overbite. Patient is mildly congested with mild pharyngeal erythema.   Chest: Clear to auscultation without wheezing, rhonchi or crackles noted.  Heart: S1+S2+0, regular and normal without murmurs, rubs or gallops noted.   Abdomen: Soft,  non-tender and non-distended.  Extremities: There is no pitting edema in the distal lower extremities bilaterally.   Skin: Warm and dry without trophic changes noted.   Musculoskeletal: exam reveals no obvious joint deformities.   Neurologically:  Mental status: The patient is awake, alert and oriented in all 4 spheres. Her immediate and remote memory, attention, language skills and fund of knowledge are appropriate. There is no evidence of aphasia, agnosia, apraxia or anomia. Speech is clear with normal prosody and enunciation. Thought process is linear. Mood is normal and affect is normal.  Cranial nerves II - XII are as described above under HEENT exam.  Motor exam: Normal bulk, strength and tone is noted. There is no obvious action or resting tremor.  Fine motor skills and coordination: grossly intact.  Cerebellar testing: No dysmetria or intention tremor. There is no truncal or gait ataxia.  Sensory exam: intact to light touch in the upper and lower extremities.  Gait, station and balance: She stands easily. No veering to one side is noted. No leaning to one side is noted. Posture is age-appropriate and stance is narrow based. Gait shows normal stride length and normal pace. No problems turning are noted.   Assessment and Plan:  In summary, DYANARA BENNION is a very pleasant 62 y.o.-year  old female with an underlying medical history of allergies, arthritis, reflux disease, carpal tunnel syndrome, arthritis, and morbid obesity with a BMI of over 56, who presents for evaluation of her obstructive sleep apnea (OSA). While a laboratory attended sleep study is typically considered "gold standard" for evaluation of sleep disordered breathing, we mutually agreed to pursue a home sleep test at this time.   I had a long chat with the patient about my findings and the diagnosis of sleep apnea, particularly OSA, its prognosis and treatment options. We talked about medical/conservative treatments, surgical interventions and non-pharmacological approaches for symptom control. I explained, in particular, the risks and ramifications of untreated moderate to severe OSA, especially with respect to developing cardiovascular disease down the road, including congestive heart failure (CHF), difficult to treat hypertension, cardiac arrhythmias (particularly A-fib), neurovascular complications including TIA, stroke and dementia. Even type 2 diabetes has, in part, been linked to untreated OSA. Symptoms of untreated OSA may include (but may not be limited to) daytime sleepiness, nocturia (i.e. frequent nighttime urination), memory problems, mood irritability and suboptimally controlled or worsening mood disorder such as depression and/or anxiety, lack of [name], lack of motivation, physical discomfort, as well as recurrent headaches, especially morning or nocturnal headaches. We talked about the importance of maintaining a healthy lifestyle and striving for healthy weight. In addition, we talked about the importance of striving for and maintaining good sleep hygiene. I recommended a sleep study at this time. I outlined the differences between a laboratory attended sleep study which is considered more comprehensive and accurate over the option of a home sleep test (HST); the latter may lead to underestimation of sleep  disordered breathing in some instances and does not help with diagnosing upper airway resistance syndrome and is not accurate enough to diagnose primary central sleep apnea typically. We will plan her HST after her upcoming carpal tunnel release surgery.   I outlined possible surgical and non-surgical treatment options of OSA, including the use of a positive airway pressure (PAP) device (i.e. CPAP, AutoPAP/APAP or BiPAP in certain circumstances), a custom-made dental device (aka oral appliance, which would require a referral to a specialist dentist or orthodontist typically, and is generally speaking  not considered for patients with full dentures or edentulous state), upper airway surgical options, such as traditional UPPP (which is not considered a first-line treatment) or the Inspire device (hypoglossal nerve stimulator, which would involve a referral for consultation with an ENT surgeon, after careful selection, following inclusion criteria - also not first-line treatment). I explained the PAP treatment option to the patient in detail, as this is generally considered first-line treatment.  The patient indicated that she would be willing to try PAP therapy, if the need arises. I explained the importance of being compliant with PAP treatment, not only for insurance purposes but primarily to improve patient's symptoms symptoms, and for the patient's long term health benefit, including to reduce Her cardiovascular risks longer-term.    We will pick up our discussion about the next steps and treatment options after testing.  We will keep her posted as to the test results by phone call and/or MyChart messaging where possible.  We will plan to follow-up in sleep clinic accordingly as well.  I answered all her questions today and the patient was in agreement.   I encouraged her to call with any interim questions, concerns, problems or updates or email [place] through [place].  Generally speaking, sleep test  authorizations may take up to 2 weeks, sometimes less, sometimes longer, the patient is encouraged to get in touch with [place] if they do not hear back from the sleep lab staff directly within the next 2 weeks.  Thank you very much for allowing me to participate in the care of this nice patient. If I can be of any further assistance to you please do not hesitate to call me at [phone].  Sincerely,   Star Age, MD, PhD

## 2022-06-16 NOTE — Patient Instructions (Addendum)
Thank you for choosing [facility] for your sleep related care! It was nice to meet you today!   Here is what we discussed today:    Based on your symptoms and your exam I believe you are still at risk for obstructive sleep apnea (aka OSA). We should proceed with a sleep study to determine whether you do or do not have OSA and how severe it is. Even, if you have mild OSA, I may want you to consider treatment with CPAP, as treatment of even borderline or mild sleep apnea can result and improvement of symptoms such as sleep disruption, daytime sleepiness, nighttime bathroom breaks, restless leg symptoms, improvement of headache syndromes, even improved mood disorder.   As explained, an attended sleep study (meaning you get to stay overnight in the sleep lab), lets [place] monitor sleep-related behaviors such as sleep talking and leg movements in sleep, in addition to monitoring for sleep apnea.  A home sleep test is a screening tool for sleep apnea diagnosis only, but unfortunately, does not help with any other sleep-related diagnoses. As discussed, we will proceed with a home sleep test at your convenience.   Please remember, the long-term risks and ramifications of untreated moderate to severe obstructive sleep apnea may include (but are not limited to): increased risk for cardiovascular disease, including congestive heart failure, stroke, difficult to control hypertension, treatment resistant obesity, arrhythmias, especially irregular heartbeat commonly known as A. Fib. (atrial fibrillation); even type 2 diabetes has been linked to untreated OSA.   Other correlations that untreated obstructive sleep apnea include macular edema which is swelling of the retina in the eyes, droopy eyelid syndrome, and elevated hemoglobin and hematocrit levels (often referred to as polycythemia).  Sleep apnea can cause disruption of sleep and sleep deprivation in most cases, which, in turn, can cause  recurrent headaches, problems with memory, mood, concentration, focus, and vigilance. Most people with untreated sleep apnea report excessive daytime sleepiness, which can affect their ability to drive. Please do not drive or use heavy equipment or machinery, if you feel sleepy! Patients with sleep apnea can also develop difficulty initiating and maintaining sleep (aka insomnia).   Having sleep apnea may increase your risk for other sleep disorders, including involuntary behaviors sleep such as sleep terrors, sleep talking, sleepwalking.    Having sleep apnea can also increase your risk for restless leg syndrome and leg movements at night.   Please note that untreated obstructive sleep apnea may carry additional perioperative morbidity. Patients with significant obstructive sleep apnea (typically, in the moderate to severe degree) should receive, if possible, perioperative PAP (positive airway pressure) therapy and the surgeons and particularly the anesthesiologists should be informed of the diagnosis and the severity of the sleep disordered breathing.   We will call you or email you through [place] with regards to your test results and plan a follow-up in sleep clinic accordingly. Most likely, you will hear from one of our nurses.   Our sleep lab administrative assistant will call you to schedule your sleep study and give you further instructions, regarding the check in process for the sleep study, arrival time, what to bring, when you can expect to leave after the study, etc., and to answer any other logistical questions you may have. If you don't hear back from her by about 2 weeks from now, please feel free to call her direct line at [phone] or you can call our general clinic number, or email [place] through My Chart.

## 2022-06-24 ENCOUNTER — Telehealth: Payer: Self-pay | Admitting: Neurology

## 2022-06-24 NOTE — Telephone Encounter (Signed)
HST- [employer] no auth req.  Patient is scheduled at [facility] for 07/08/22 at 2 pm.  Mailed packet to the patient.

## 2022-07-06 ENCOUNTER — Other Ambulatory Visit: Payer: Self-pay | Admitting: Obstetrics and Gynecology

## 2022-07-07 NOTE — Telephone Encounter (Signed)
Med refill request: Provera Last AEX: 06/07/21 Next AEX: 07/24/22 Last MMG (if hormonal med) Refill authorized: Please Advise?

## 2022-07-08 ENCOUNTER — Encounter (HOSPITAL_BASED_OUTPATIENT_CLINIC_OR_DEPARTMENT_OTHER): Payer: 59 | Admitting: Obstetrics & Gynecology

## 2022-07-08 ENCOUNTER — Ambulatory Visit: Payer: 59 | Admitting: Neurology

## 2022-07-08 DIAGNOSIS — G4733 Obstructive sleep apnea (adult) (pediatric): Secondary | ICD-10-CM

## 2022-07-08 DIAGNOSIS — G4734 Idiopathic sleep related nonobstructive alveolar hypoventilation: Secondary | ICD-10-CM

## 2022-07-08 DIAGNOSIS — R351 Nocturia: Secondary | ICD-10-CM

## 2022-07-08 DIAGNOSIS — Z820 Family history of epilepsy and other diseases of the nervous system: Secondary | ICD-10-CM

## 2022-07-08 DIAGNOSIS — G4719 Other hypersomnia: Secondary | ICD-10-CM

## 2022-07-10 NOTE — Progress Notes (Signed)
See procedure note.

## 2022-07-14 NOTE — Addendum Note (Signed)
Addended by: Huston Foley on: 07/14/2022 04:55 PM   Modules accepted: Orders

## 2022-07-14 NOTE — Procedures (Signed)
[facility]  HOME SLEEP TEST (Watch PAT) REPORT  STUDY DATE: 07/08/22  DOB: [birth year]  MRN: [id number]  ORDERING CLINICIAN: Huston Foley, MD, PhD   REFERRING CLINICIANChilton Greathouse, MD   CLINICAL INFORMATION/HISTORY: 62 year old female with an underlying medical history of allergies, arthritis, reflux disease, carpal tunnel syndrome, arthritis, and morbid obesity with a BMI of over 45, who was previously diagnosed with obstructive sleep apnea and placed on PAP therapy.  She no longer uses her PAP machine.   Epworth sleepiness score: 18/24.  BMI: 45.4 kg/m  FINDINGS:   Sleep Summary:   Total Recording Time (hours, min): 7 hours, 51 min  Total Sleep Time (hours, min):  6 hours, 55 min  Percent REM (%):    20.7%   Respiratory Indices:   Calculated pAHI (per hour):  32.3/hour         REM pAHI:    63.7/hour       NREM pAHI: 25/hour  Central pAHI: 0.3/hour  Oxygen Saturation Statistics:    Oxygen Saturation (%) Mean: 93%   Minimum oxygen saturation (%):                 71%   O2 Saturation Range (%): 71 - 97%    O2 Saturation (minutes) <=88%: 18.9 min  Pulse Rate Statistics:   Pulse Mean (bpm):    79/min    Pulse Range (53 - 100/min)   IMPRESSION: OSA (obstructive sleep apnea), severe Nocturnal Hypoxemia  RECOMMENDATION:  This home sleep test demonstrates severe obstructive sleep apnea with a total AHI of 32.3/hour and O2 nadir of 71% with significant time below or at 88% saturation of over 15 minutes, indicating nocturnal hypoxemia.  Snoring was detected, in the mild to moderate range.  Treatment with positive airway pressure is highly recommended. The patient will be advised to proceed with an autoPAP titration/trial at home. A laboratory attended titration study can be considered in the future for optimization of treatment settings and to improve tolerance and compliance. Alternative treatment options are limited secondary to the  severity of the patient's sleep disordered breathing, but may include surgical treatment with an implantable hypoglossal nerve stimulator (in carefully selected candidates, meeting criteria).  Concomitant weight loss is highly recommended (where clinically appropriate). Please note, that untreated obstructive sleep apnea may carry additional perioperative morbidity. Patients with significant obstructive sleep apnea should receive perioperative PAP therapy and the surgeons and particularly the anesthesiologist should be informed of the diagnosis and the severity of the sleep disordered breathing. The patient should be cautioned not to drive, work at heights, or operate dangerous or heavy equipment when tired or sleepy. Review and reiteration of good sleep hygiene measures should be pursued with any patient. Other causes of the patient's symptoms, including circadian rhythm disturbances, an underlying mood disorder, medication effect and/or an underlying medical problem cannot be ruled out based on this test. Clinical correlation is recommended.  The patient and her referring provider will be notified of the test results. The patient will be seen in follow up in sleep clinic at [facility].  I certify that I have reviewed the raw data recording prior to the issuance of this report in accordance with the standards of the American Academy of Sleep Medicine (AASM).    INTERPRETING PHYSICIAN:   Huston Foley, MD, PhD Medical Director, [facility]) Diplomat, ABPN (Neurology and Sleep)   [facility] [address] [place], [place] [address] [phone]  [phone]         .           

## 2022-07-16 ENCOUNTER — Telehealth: Payer: Self-pay

## 2022-07-16 NOTE — Telephone Encounter (Signed)
I called pt. I advised pt that Dr. Frances Furbish reviewed their sleep study results and found that pt has severe osa. Dr. Frances Furbish recommends that pt start an auto-PAP at home. I reviewed PAP compliance expectations with the pt. Pt is agreeable to starting an auto-PAP. I advised pt that an order will be sent to a DME, [facility], and [facility] will call the pt within about one week after they file with the pt's insurance. [facility] will show the pt how to use the machine, fit for masks, and troubleshoot the auto-PAP if needed.  Pt verbalized understanding of results. Pt had no questions at this time but was encouraged to call back if questions arise. I have sent the order to [facility] and have received confirmation that they have received the order.   Patient will need a 31-90 day follow up after starting her auto-PAP. Please schedule her for an appointment from late June through early August.

## 2022-07-16 NOTE — Telephone Encounter (Signed)
-----   Message from Huston Foley, MD sent at 07/14/2022  4:55 PM EDT ----- Patient referred by Dr. Felipa Eth, seen by me on 06/16/2022, patient had a HST on 07/08/2022.    Please call and notify the patient that the recent home sleep test showed obstructive sleep apnea in the severe range. I recommend treatment for this in the form of autoPAP, which means, that we don't have to bring her in for a sleep study with CPAP, but will let her start using a so called autoPAP machine at home, through a DME company (of her choice, or as per insurance requirement). The DME representative will fit the patient with a mask of choice, educate her on how to use the machine, how to put the mask on, etc. I have placed an order in the chart. Please send the order to a local DME, talk to patient, send report to referring MD. Please also reinforce the need for compliance with treatment. We will need a FU in sleep clinic for 10 weeks post-PAP set up, please arrange that with me or one of our NPs. Thanks,   Huston Foley, MD, PhD [facility])

## 2022-07-17 NOTE — Telephone Encounter (Signed)
Called pt. Scheduled her initial auto pap with Dr. Frances Furbish on 7/10 @ 1:15 pm.

## 2022-07-17 NOTE — Telephone Encounter (Signed)
Noted  

## 2022-07-24 ENCOUNTER — Encounter: Payer: Self-pay | Admitting: Obstetrics and Gynecology

## 2022-07-24 ENCOUNTER — Other Ambulatory Visit: Payer: Self-pay | Admitting: Obstetrics and Gynecology

## 2022-07-24 ENCOUNTER — Ambulatory Visit (INDEPENDENT_AMBULATORY_CARE_PROVIDER_SITE_OTHER): Payer: 59

## 2022-07-24 ENCOUNTER — Ambulatory Visit: Payer: 59 | Admitting: Obstetrics and Gynecology

## 2022-07-24 VITALS — BP 128/82 | Wt 233.0 lb

## 2022-07-24 DIAGNOSIS — Z8742 Personal history of other diseases of the female genital tract: Secondary | ICD-10-CM

## 2022-07-24 DIAGNOSIS — N950 Postmenopausal bleeding: Secondary | ICD-10-CM

## 2022-07-24 NOTE — Progress Notes (Signed)
GYNECOLOGY  VISIT   HPI: 61 y.o.   Married White or Caucasian Not Hispanic or Latino  female   [phone] with Patient's last menstrual period was 04/02/2018.   here for further evaluation of PMP bleeding  The patient has a h/o endometrial hyperplasia without atypia in 2015. She had been treated with cyclic provera, then daily provera.She had an endometrial biopsy in 3/23 secondary to continued bleeding while on provera. The biopsy returned with benign inactive endometrium. U/S with a thin endometrial stripe in 5/23.   At the time of her annual exam last month she reported intermittent vaginal spotting.  An endometrial biopsy returned with weakly proliferative to atrophic appearing endometrium.  She has a consultation with Dr Hyacinth Meeker later this month to discuss hysterectomy  GYNECOLOGIC HISTORY: Patient's last menstrual period was 04/02/2018. Contraception:PMP Menopausal hormone therapy: daily provera        OB History     Gravida  4   Para  2   Term  2   Preterm      AB  2   Living  2      SAB  2   IAB      Ectopic      Multiple      Live Births  2              Patient Active Problem List   Diagnosis Date Noted   Pain in right hand 11/28/2020   Arthritis of right wrist 11/28/2020   Cervical kyphosis 11/19/2018   Bilateral carpal tunnel syndrome 04/12/2018   Gastro-esophageal reflux disease without esophagitis 09/15/2017   Knee pain 06/01/2017   Impaired fasting glucose 03/17/2017   Family history of malignant neoplasm of trachea, bronchus and lung 02/23/2015   Obesity, morbid 03/08/2013   Post-menopausal bleeding 03/08/2013   Obstructive sleep apnea syndrome 05/25/2009   Seasonal allergic rhinitis 05/25/2009    Past Medical History:  Diagnosis Date   Acid reflux     Past Surgical History:  Procedure Laterality Date   CARPAL TUNNEL RELEASE Left 2020   DILATATION & CURETTAGE/HYSTEROSCOPY WITH TRUECLEAR N/A 07/27/2013   Procedure: DILATATION &  CURETTAGE/HYSTEROSCOPY WITH TRUCLEAR;  Surgeon: Bennye Alm, MD;  Location: [facility];  Service: Gynecology;  Laterality: N/A;   ENDOMETRIAL BIOPSY  04/07/2013   neg    Current Outpatient Medications  Medication Sig Dispense Refill   medroxyPROGESTERone (PROVERA) 10 MG tablet TAKE 1 TABLET BY MOUTH EVERY DAY 90 tablet 3   pantoprazole (PROTONIX) 40 MG tablet Take 40 mg by mouth daily.     WEGOVY 1 MG/0.5ML SOAJ INJECT  EVERY WEEK SUBCUTANEOUSLY AS DIRECTED 30 DAYS     No current facility-administered medications for this visit.     ALLERGIES: Patient has no known allergies.  Family History  Problem Relation Age of Onset   Breast cancer Mother    Cancer Mother        lung   Cancer Father        pancreatic   Heart disease Father    Diabetes Father    Sleep apnea Brother    Cancer Maternal Grandmother    Cancer Paternal Grandmother        pancreatic    Social History   Socioeconomic History   Marital status: Married    Spouse name: Not on file   Number of children: Not on file   Years of education: Not on file   Highest education level: Not on file  Occupational History  Not on file  Tobacco Use   Smoking status: Former   Smokeless tobacco: Never   Tobacco comments:    As a teen  Vaping Use   Vaping Use: Never used  Substance and Sexual Activity   Alcohol use: No   Drug use: No   Sexual activity: Yes    Partners: Male    Comment: husband vasectomy  Other Topics Concern   Not on file  Social History Narrative   Caffeine: maybe 3 cups/day (coffee in AM and then maybe tea)   Right handed   Social Determinants of Health   Financial Resource Strain: Not on file  Food Insecurity: Not on file  Transportation Needs: Not on file  Physical Activity: Not on file  Stress: Not on file  Social Connections: Not on file  Intimate Partner Violence: Not on file    ROS  PHYSICAL EXAMINATION:    BP 128/82   Wt 233 lb (105.7 kg)   LMP 04/02/2018   BMI 44.02  kg/m     General appearance: alert, cooperative and appears stated age  Pelvic ultrasound, both transabdominal and transvaginal imaging was needed  Indications: PMP bleeding  Findings:  Uterus 8.68 x 5.59 x 4.58 cm, anteverted  Endometrium max of 4.44 mm, + feeder vessel, 6.7 x 4.1 mm echogenic area  Left ovary 2.07 x 1.40 x 1.07 cm  Right ovary 2.38 x 1.17 x 1.69 cm  No free fluid  Impression:  Normal sized anteverted uterus Endometrial stripe of 4.44 mm, suspected endometrial polyp Normal ovaries bilaterally  1. PMB (postmenopausal bleeding) H/O endometrial hyperplasia back in 2015. She has been on provera since then and has had repeated episodes of bleeding. Last year she had an endometrial biopsy with atrophy and a thin endometrial stripe. This year her biopsy returned with proliferative endometrium and u/s shows a stripe of 4.44 mm and is concerning for an endometrial polyp. -Given her persistent bleeding, proliferative endometrium while on provera we have discussed hysterectomy. Other option would be a hysteroscopy, D&C with consideration of mirena IUD insertion. The patient is interested in hysterectomy. -She has an appointment with Dr Hyacinth Meeker to discuss surgery  2. History of endometrial hyperplasia

## 2022-08-05 ENCOUNTER — Encounter (HOSPITAL_BASED_OUTPATIENT_CLINIC_OR_DEPARTMENT_OTHER): Payer: Self-pay | Admitting: Obstetrics & Gynecology

## 2022-08-05 ENCOUNTER — Ambulatory Visit (HOSPITAL_BASED_OUTPATIENT_CLINIC_OR_DEPARTMENT_OTHER): Payer: 59 | Admitting: Obstetrics & Gynecology

## 2022-08-05 VITALS — BP 150/82 | HR 114 | Ht 61.0 in | Wt 233.6 lb

## 2022-08-05 DIAGNOSIS — Z6841 Body Mass Index (BMI) 40.0 and over, adult: Secondary | ICD-10-CM | POA: Diagnosis not present

## 2022-08-05 DIAGNOSIS — N950 Postmenopausal bleeding: Secondary | ICD-10-CM

## 2022-08-05 DIAGNOSIS — R935 Abnormal findings on diagnostic imaging of other abdominal regions, including retroperitoneum: Secondary | ICD-10-CM | POA: Diagnosis not present

## 2022-08-05 NOTE — Progress Notes (Signed)
GYNECOLOGY  VISIT  CC:   discussion of possible surgery  HPI: 62 y.o. G4P2022 Married White or Caucasian female here for discussion of possible surgery due to postmenopausal bleeding.  Pt has hx of endometrial hyperplasia without atypia in 2015.  She was treated with cyclic progesterone and then daily progesterone.  Endometrial biopsy was done 06/2021 due to bleeding.  This showed inactive endometrium.  Ultrasound 08/20/2021 showed endometrium measuring 2.53mm.   Then she had some additional spotting again this year.  Biopsy was obtained 06/11/2022 showing:  weakly proliferative to atrophic appearing endometrium with focal pseudodecidualization suggestive of steroid effect in the background of abundant blood.   Ultrasound 4/23 showed endometrium of 4.80mm and feeder vessel with 7mm echogenic area.  Additional evaluation with surgery discussed.  Dr. Oscar La is retiring so she is here to discuss this today.  I did reviewed all of these results independently.  Also, last pap was neg and was 06/11/2022.  Hysterectomy was discussed and hysteroscopy with polyp resection was discussed.  Pt would like to proceed with less aggressive surgery.  She is also working on weight loss and feels if something bigger is needed, she will be in better shape for this in a few more months.  Procedure discussed with patient.  Recovery and pain management discussed.  Risks discussed including but not limited to bleeding, rare risk of transfusion, infection, 1% risk of uterine perforation with risks of fluid deficit causing cardiac arrythmia, cerebral swelling and/or need to stop procedure early.  Fluid emboli and rare risk of death discussed.  DVT/PE, rare risk of risk of bowel/bladder/ureteral/vascular injury.  Patient aware if pathology abnormal she may need additional treatment.  All questions answered.      Past Medical History:  Diagnosis Date   Acid reflux     MEDS:   Current Outpatient Medications on File Prior to Visit   Medication Sig Dispense Refill   medroxyPROGESTERone (PROVERA) 10 MG tablet TAKE 1 TABLET BY MOUTH EVERY DAY 90 tablet 3   pantoprazole (PROTONIX) 40 MG tablet Take 40 mg by mouth daily.     WEGOVY 1 MG/0.5ML SOAJ INJECT 1MG  EVERY WEEK SUBCUTANEOUSLY AS DIRECTED 30 DAYS     No current facility-administered medications on file prior to visit.    ALLERGIES: Patient has no known allergies.  SH:  married, non smoker  Review of Systems  Constitutional: Negative.   Genitourinary:        PMP bleeding    PHYSICAL EXAMINATION:    BP (!) 143/89 (BP Location: Left Arm, Patient Position: Sitting, Cuff Size: Large)   Pulse (!) 114   Ht 5\' 1"  (1.549 m) Comment: Reported  Wt 233 lb 9.6 oz (106 kg)   LMP 04/02/2018   BMI 44.14 kg/m     General appearance: alert, cooperative and appears stated age CV:  Regular rate and rhythm Lungs:  clear to auscultation, no wheezes, rales or rhonchi, symmetric air entry   Assessment/Plan: 1. Postmenopausal bleeding - will proceed with surgical scheduling of hysteroscopy with polyp resection, D&C  2. Abnormal ultrasound of endometrium  3. BMI 40.0-44.9, adult (HCC) - on wegovy.  Discussed need to stop at lest one week before surgery and need for full NPO after midnight status due to slower GI motility and increased risks for aspiration due to prolonged time food is in the stomach.  Pt voices understanding.

## 2022-08-18 ENCOUNTER — Ambulatory Visit (INDEPENDENT_AMBULATORY_CARE_PROVIDER_SITE_OTHER): Payer: 59 | Admitting: Family Medicine

## 2022-08-18 VITALS — BP 133/89 | HR 95 | Ht 61.25 in | Wt 225.0 lb

## 2022-08-18 DIAGNOSIS — Z6841 Body Mass Index (BMI) 40.0 and over, adult: Secondary | ICD-10-CM

## 2022-08-18 DIAGNOSIS — R7303 Prediabetes: Secondary | ICD-10-CM | POA: Diagnosis not present

## 2022-08-18 DIAGNOSIS — G4733 Obstructive sleep apnea (adult) (pediatric): Secondary | ICD-10-CM

## 2022-08-18 DIAGNOSIS — Z0289 Encounter for other administrative examinations: Secondary | ICD-10-CM

## 2022-08-18 DIAGNOSIS — E66813 Obesity, class 3: Secondary | ICD-10-CM

## 2022-08-19 NOTE — Progress Notes (Signed)
Office: [phone]  /  Fax: [phone]   Initial Visit  Catherine [name] was seen in clinic today to evaluate for obesity. She is interested in losing weight to improve overall health and reduce the risk of weight related complications. She presents today to review program treatment options, initial physical assessment, and evaluation.     She was referred by: PCP  Some associated conditions: OSA, Prediabetes, and Other: Endometrial hypoplasia  Current or previous pharmacotherapy: GLP-1  Response to medication: Lost weight and was able to maintain weight loss   Past medical history includes:   Past Medical History:  Diagnosis Date   Acid reflux      Objective:   BP 133/89   Pulse 95   Ht 5' 1.25" (1.556 m)   Wt 225 lb (102.1 kg)   LMP 04/02/2018   SpO2 96%   BMI 42.17 kg/m  She was weighed on the bioimpedance scale: Body mass index is 42.17 kg/m. Visceral Fat Rating:17, Body Fat%:50, Weight trend over the last 12 months: Decreasing  General:  Alert, oriented and cooperative. Patient is in no acute distress.  Respiratory: Normal respiratory effort, no problems with respiration noted  Extremities: Normal range of motion.    Mental Status: Normal mood and affect. Normal behavior. Normal judgment and thought content.   Assessment and Plan:  1. Prediabetes Lillah reports a recent A1c of 6.0.  Patient was given paperwork to fill out and will return for nutrition and bioimpedance testing.  2. OSA (obstructive sleep apnea) Katlynne was recently diagnosed with severe obstructive sleep apnea and she is on a CPAP, but she is struggling.  Patient was educated on the importance of her CPAP compliance.  3. Class 3 severe obesity with serious comorbidity and body mass index (BMI) of 40.0 to 44.9 in adult, unspecified obesity type (HCC) We reviewed weight, biometrics, associated medical conditions and contributing factors with patient. She would benefit from weight loss therapy  via a modified calorie, low-carb, high-protein nutritional plan tailored to their REE (resting energy expenditure) which will be determined by indirect calorimetry.  We will also assess for cardiometabolic risk and nutritional derangements via fasting serologies at her next appointment.      Obesity Treatment / Action Plan:  Patient will work on garnering support from family and friends to begin weight loss journey. Will complete provided nutritional and psychosocial assessment questionnaire before the next appointment. Will be scheduled for indirect calorimetry to determine resting energy expenditure in a fasting state.  This will allow [place] to create a reduced calorie, high-protein meal plan to promote loss of fat mass while preserving muscle mass. Will avoid skipping meals which may result in increased hunger signals and overeating at certain times. Was counseled on nutritional approaches to weight loss and benefits of reducing processed foods and consuming plant-based foods and high quality protein as part of nutritional weight management.  Obesity Education Performed Today:  She was weighed on the bioimpedance scale and results were discussed and documented in the synopsis.  We discussed obesity as a disease and the importance of a more detailed evaluation of all the factors contributing to the disease.  We discussed the importance of long term lifestyle changes which include nutrition, exercise and behavioral modifications as well as the importance of customizing this to her specific health and social needs.  We discussed the benefits of reaching a healthier weight to alleviate the symptoms of existing conditions and reduce the risks of the biomechanical, metabolic and psychological effects  of obesity.  Catherine [name] appears to be in the action stage of change and states they are ready to start intensive lifestyle modifications and behavioral modifications.  I have personally spent 45  minutes total time today in preparation, patient care, and documentation for this visit, including the following: review of clinical lab tests; review of medical tests/procedures/services.  Reviewed by clinician on day of visit: allergies, medications, problem list, medical history, surgical history, family history, social history, and previous encounter notes.   I, Burt Knack, am acting as transcriptionist for Quillian Quince, MD   I have reviewed the above documentation for accuracy and completeness, and I agree with the above. Quillian Quince, MD

## 2022-08-21 ENCOUNTER — Telehealth: Payer: Self-pay

## 2022-08-22 NOTE — Telephone Encounter (Signed)
Contacted patient on 08/21/22 to provide optional dates for surgery and pre-op information. Patient elected to have the surgery scheduled for 09/23/22, [facility]  at 10:30 am. Pt was given procedure instructions and knows to arrive at 8:30 am.

## 2022-08-26 ENCOUNTER — Encounter (HOSPITAL_BASED_OUTPATIENT_CLINIC_OR_DEPARTMENT_OTHER): Payer: Self-pay

## 2022-08-27 ENCOUNTER — Telehealth (HOSPITAL_BASED_OUTPATIENT_CLINIC_OR_DEPARTMENT_OTHER): Payer: Self-pay | Admitting: Obstetrics & Gynecology

## 2022-08-27 NOTE — Telephone Encounter (Signed)
Patient called and left a message  that she returning Kim's call. 

## 2022-08-28 ENCOUNTER — Telehealth (HOSPITAL_BASED_OUTPATIENT_CLINIC_OR_DEPARTMENT_OTHER): Payer: Self-pay | Admitting: *Deleted

## 2022-08-28 ENCOUNTER — Other Ambulatory Visit (HOSPITAL_BASED_OUTPATIENT_CLINIC_OR_DEPARTMENT_OTHER): Payer: Self-pay | Admitting: Obstetrics & Gynecology

## 2022-08-28 DIAGNOSIS — Z01818 Encounter for other preprocedural examination: Secondary | ICD-10-CM

## 2022-08-28 NOTE — Telephone Encounter (Signed)
-----   Message from [name], MD sent at 08/28/2022  5:34 AM EDT ----- Regarding: FW: Surgery 6/18 Catherine [name], Can you please call this pt and let her know that she does need to stop her wegovy at least 7 days prior to surgery and that she will need to be NPO after midnight with no food or liquids (unless the pre op nurse tells her differently).  The wegovy slows stomach emptying so to make sure this is addressed when pre op nurse calls her.  Thank you.  Surgery scheduled 09/23/2022.  MSM  ----- Message ----- From: Donne Hazel Sent: 08/21/2022   5:13 PM EDT To: Harrie Jeans, RN; [name], MD; # Subject: Surgery 6/18                                   Done.  Posted for 6/18 at 1100 w/ Dr. Hyacinth Meeker at [facility] CPT [phone] DX [phone]  (Polyp)[id number]   ----- Message ----- From: Olevia Bowens Sent: 08/21/2022   4:07 PM EDT To: Donne Hazel  I would offer her 6/18, 7/15 or 7/22. Hyacinth Meeker does not operate w/ anyone on 7/15 or 7/22 so that day if perfect for minor cases.  Do not offer her 7/1, save that day for major cases. ----- Message ----- From: Donne Hazel Sent: 08/21/2022   4:00 PM EDT To: Tera Partridge Battle  CPT [phone] DX [phone] TUES 6/18 10:30 or Mon 7/1 10:30 [facility]?   ----- Message ----- From: Olevia Bowens Sent: 08/11/2022  11:29 AM EDT To: Donne Hazel   ----- Message ----- From: [name], MD Sent: 08/10/2022   1:34 PM EDT To: Tera Partridge Battle Subject: surgery                                        Jacinda, This pt needs hysteroscopy with polyp resection, D&C.  Dx:  PMP bleeding, endometrial polyp  No assistant needed.  Thank you.  Leda Quail

## 2022-08-28 NOTE — Telephone Encounter (Signed)
Advised pt to stop taking wegovy at least 7 days prior to her procedure on June 18 and she will need to be NPO after midnight the night before unless the pre-op nurse tells her differently. Pt states that she stopped taking the wegovy yesterday and does not plan to start back until after the procedure.

## 2022-09-02 ENCOUNTER — Ambulatory Visit (INDEPENDENT_AMBULATORY_CARE_PROVIDER_SITE_OTHER): Payer: 59 | Admitting: Family Medicine

## 2022-09-02 ENCOUNTER — Encounter (INDEPENDENT_AMBULATORY_CARE_PROVIDER_SITE_OTHER): Payer: Self-pay | Admitting: Family Medicine

## 2022-09-02 VITALS — BP 132/83 | HR 83 | Temp 97.9°F | Ht 61.0 in | Wt 226.4 lb

## 2022-09-02 DIAGNOSIS — R7303 Prediabetes: Secondary | ICD-10-CM | POA: Diagnosis not present

## 2022-09-02 DIAGNOSIS — R5383 Other fatigue: Secondary | ICD-10-CM | POA: Diagnosis not present

## 2022-09-02 DIAGNOSIS — Z6841 Body Mass Index (BMI) 40.0 and over, adult: Secondary | ICD-10-CM

## 2022-09-02 DIAGNOSIS — Z1331 Encounter for screening for depression: Secondary | ICD-10-CM | POA: Diagnosis not present

## 2022-09-02 DIAGNOSIS — R0602 Shortness of breath: Secondary | ICD-10-CM

## 2022-09-03 LAB — CMP14+EGFR
ALT: 12 IU/L (ref 0–32)
AST: 14 IU/L (ref 0–40)
Albumin/Globulin Ratio: 1.7 (ref 1.2–2.2)
Albumin: 4.3 g/dL (ref 3.9–4.9)
Alkaline Phosphatase: 87 IU/L (ref 44–121)
BUN/Creatinine Ratio: 15 (ref 12–28)
BUN: 11 mg/dL (ref 8–27)
Bilirubin Total: 0.3 mg/dL (ref 0.0–1.2)
CO2: 18 mmol/L — ABNORMAL LOW (ref 20–29)
Calcium: 9.2 mg/dL (ref 8.7–10.3)
Chloride: 105 mmol/L (ref 96–106)
Creatinine, Ser: 0.75 mg/dL (ref 0.57–1.00)
Globulin, Total: 2.6 g/dL (ref 1.5–4.5)
Glucose: 88 mg/dL (ref 70–99)
Potassium: 4.4 mmol/L (ref 3.5–5.2)
Sodium: 142 mmol/L (ref 134–144)
Total Protein: 6.9 g/dL (ref 6.0–8.5)
eGFR: 90 mL/min/{1.73_m2} (ref >59)

## 2022-09-03 LAB — CBC WITH DIFFERENTIAL/PLATELET
Basophils Absolute: 0.1 10*3/uL (ref 0.0–0.2)
Basos: 1 %
EOS (ABSOLUTE): 0.4 10*3/uL (ref 0.0–0.4)
Eos: 5 %
Hematocrit: 42 % (ref 34.0–46.6)
Hemoglobin: 13.2 g/dL (ref 11.1–15.9)
Immature Grans (Abs): 0 10*3/uL (ref 0.0–0.1)
Immature Granulocytes: 0 %
Lymphocytes Absolute: 1.8 10*3/uL (ref 0.7–3.1)
Lymphs: 19 %
MCH: 26.4 pg — ABNORMAL LOW (ref 26.6–33.0)
MCHC: 31.4 g/dL — ABNORMAL LOW (ref 31.5–35.7)
MCV: 84 fL (ref 79–97)
Monocytes Absolute: 0.6 10*3/uL (ref 0.1–0.9)
Monocytes: 6 %
Neutrophils Absolute: 6.7 10*3/uL (ref 1.4–7.0)
Neutrophils: 69 %
Platelets: 297 10*3/uL (ref 150–450)
RBC: 5 x10E6/uL (ref 3.77–5.28)
RDW: 15 % (ref 11.7–15.4)
WBC: 9.6 10*3/uL (ref 3.4–10.8)

## 2022-09-03 LAB — FOLATE: Folate: 4.7 ng/mL (ref >3.0)

## 2022-09-03 LAB — INSULIN, RANDOM: INSULIN: 28.7 u[IU]/mL — ABNORMAL HIGH (ref 2.6–24.9)

## 2022-09-03 LAB — LIPID PANEL WITH LDL/HDL RATIO
Cholesterol, Total: 136 mg/dL (ref 100–199)
HDL: 47 mg/dL (ref >39)
LDL Chol Calc (NIH): 73 mg/dL (ref 0–99)
LDL/HDL Ratio: 1.6 ratio (ref 0.0–3.2)
Triglycerides: 80 mg/dL (ref 0–149)
VLDL Cholesterol Cal: 16 mg/dL (ref 5–40)

## 2022-09-03 LAB — HEMOGLOBIN A1C
Est. average glucose Bld gHb Est-mCnc: 128 mg/dL
Hgb A1c MFr Bld: 6.1 % — ABNORMAL HIGH (ref 4.8–5.6)

## 2022-09-03 LAB — TSH: TSH: 3.08 u[IU]/mL (ref 0.450–4.500)

## 2022-09-03 LAB — VITAMIN D 25 HYDROXY (VIT D DEFICIENCY, FRACTURES): Vit D, 25-Hydroxy: 57.7 ng/mL (ref 30.0–100.0)

## 2022-09-03 LAB — VITAMIN B12: Vitamin B-12: 426 pg/mL (ref 232–1245)

## 2022-09-03 NOTE — Progress Notes (Unsigned)
Chief Complaint:   OBESITY Catherine Catherine [name] (MR# [id number]) is Catherine [name] 62 y.o. female who presents for evaluation and treatment of obesity and related comorbidities. Current BMI is Body mass index is 42.78 kg/m. Catherine Catherine [name] has been struggling with her weight for many years and has been unsuccessful in either losing weight, maintaining weight loss, or reaching her healthy weight goal.  Catherine Catherine [name] is currently in the action stage of change and ready to dedicate time achieving and maintaining Catherine [name] healthier weight. Catherine Catherine [name] is interested in becoming our patient and working on intensive lifestyle modifications including (but not limited to) diet and exercise for weight loss.  Patient has been on Wegovy at 1 mg for 3 months, but she stopped approximately 2 weeks ago Catherine I asked.  She notes Catherine [name] mild increase in hunger but not back to her baseline.  She will be going to the beach next week with family.  Catherine Catherine [name]'s habits were reviewed today and are Catherine follows: Her family eats meals together, she thinks her family will eat healthier with her, her desired weight loss is 76 lbs, she has been heavy most of her life, she started gaining weight after having children, her heaviest weight ever was 242 pounds, she has significant food cravings issues, she snacks frequently in the evenings, she skips meals frequently, she is frequently drinking liquids with calories, she frequently makes poor food choices, she frequently eats larger portions than normal, and she struggles with emotional eating.  Depression Screen Catherine Catherine [name]'s Food and Mood (modified PHQ-9) score was 10.  Subjective:   1. Other fatigue Catherine Catherine [name] admits to daytime somnolence and admits to waking up still tired. Patient has Catherine [name] history of symptoms of daytime fatigue and morning fatigue. Catherine Catherine [name] generally gets 5 hours of sleep per night, and states that she has nightime awakenings. Snoring is present. Apneic episodes are present. Epworth Sleepiness Score is 11.   2. SOBOE (shortness of  breath on exertion) Catherine Catherine [name] notes increasing shortness of breath with exercising and seems to be worsening over time with weight gain. She notes getting out of breath sooner with activity than she used to. This has not gotten worse recently. Catherine [name] denies shortness of breath at rest or orthopnea.  3. Prediabetes Patient has Catherine [name] history of elevated glucose and A1c on previous labs.  She notes polyphagia.  She is on Wegovy, but she has been off for 2 weeks.  Assessment/Plan:   1. Other fatigue Catherine Catherine [name] does feel that her weight is causing her energy to be lower than it should be. Fatigue may be related to obesity, depression or many other causes. Labs will be ordered, and in the meanwhile, Catherine Catherine [name] will focus on self care including making healthy food choices, increasing physical activity and focusing on stress reduction.  - EKG 12-Lead - CBC with Differential/Platelet - Folate - VITAMIN D 25 Hydroxy (Vit-D Deficiency, Fractures) - TSH  2. SOBOE (shortness of breath on exertion) Catherine Catherine [name] does feel that she gets out of breath more easily that she used to when she exercises. Catherine Catherine [name]'s shortness of breath appears to be obesity related and exercise induced.  Patient's IC shows RMR slightly above expected range.  We will work on weight loss and increase exercise to improve exercise intolerance. Will continue to monitor closely.  3. Prediabetes We will check labs today.  Patient is to start her category 2 plan, and we will follow-up in 2 weeks for further evaluation.  - Vitamin B12 - CMP14+EGFR - Lipid Panel With LDL/HDL Ratio -  Insulin, random - Hemoglobin A1c  4. Depression screening Catherine Catherine [name] had Catherine [name] positive depression screening. Depression is commonly associated with obesity and often results in emotional eating behaviors. We will monitor this closely and work on CBT to help improve the non-hunger eating patterns. Referral to Psychology may be required if no improvement is seen Catherine she continues in our clinic.  5.  Class 3 severe obesity with serious comorbidity and body mass index (BMI) of 40.0 to 44.9 in adult, unspecified obesity type (HCC) Catherine Catherine [name] is currently in the action stage of change and her goal is to continue with weight loss efforts. I recommend Catherine Catherine [name] begin the structured treatment plan Catherine follows:  She has agreed to the Category 2 Plan + 100 calories.  Exercise goals: No exercise has been prescribed for now, while we concentrate on nutritional goals.   Behavioral modification strategies: increasing lean protein intake and travel eating strategies.  She was informed of the importance of frequent follow-up visits to maximize her success with intensive lifestyle modifications for her multiple health conditions. She was informed we would discuss her lab results at her next visit unless there is Catherine [name] critical issue that needs to be addressed sooner. Catherine Catherine [name] agreed to keep her next visit at the agreed upon time to discuss these results.  Objective:   Blood pressure 132/83, pulse 83, temperature 97.9 F (36.6 C), height 5\' 1"  (1.549 m), weight 226 lb 6.4 oz (102.7 kg), last menstrual period 04/02/2018, SpO2 (!) 69 %. Body mass index is 42.78 kg/m.  EKG: Normal sinus rhythm, rate 75 BPM.  Indirect Calorimeter completed today shows Catherine [name] VO2 of 254 and Catherine [name] REE of 1757.  Her calculated basal metabolic rate is 1610 thus her basal metabolic rate is better than expected.  General: Cooperative, alert, well developed, in no acute distress. HEENT: Conjunctivae and lids unremarkable. Cardiovascular: Regular rhythm.  Lungs: Normal work of breathing. Neurologic: No focal deficits.   Lab Results  Component Value Date   CREATININE 0.75 09/02/2022   BUN 11 09/02/2022   NA 142 09/02/2022   K 4.4 09/02/2022   CL 105 09/02/2022   CO2 18 (L) 09/02/2022   Lab Results  Component Value Date   ALT 12 09/02/2022   AST 14 09/02/2022   ALKPHOS 87 09/02/2022   BILITOT 0.3 09/02/2022   Lab Results  Component Value Date    HGBA1C 6.1 (H) 09/02/2022   Lab Results  Component Value Date   INSULIN 28.7 (H) 09/02/2022   Lab Results  Component Value Date   TSH 3.080 09/02/2022   Lab Results  Component Value Date   CHOL 136 09/02/2022   HDL 47 09/02/2022   LDLCALC 73 09/02/2022   TRIG 80 09/02/2022   Lab Results  Component Value Date   WBC 9.6 09/02/2022   HGB 13.2 09/02/2022   HCT 42.0 09/02/2022   MCV 84 09/02/2022   PLT 297 09/02/2022   No results found for: "IRON", "TIBC", "FERRITIN"  Attestation Statements:   Reviewed by clinician on day of visit: allergies, medications, problem list, medical history, surgical history, family history, social history, and previous encounter notes.  Time spent on visit including pre-visit chart review and post-visit charting and care was 40 minutes.   I, Burt Knack, am acting Catherine transcriptionist for Quillian Quince, MD.  I have reviewed the above documentation for accuracy and completeness, and I agree with the above. - Quillian Quince, MD

## 2022-09-04 ENCOUNTER — Encounter (HOSPITAL_BASED_OUTPATIENT_CLINIC_OR_DEPARTMENT_OTHER): Payer: Self-pay | Admitting: Obstetrics & Gynecology

## 2022-09-04 NOTE — Progress Notes (Signed)
Spoke w/ via phone for pre-op interview--- Catherine [name] Lab needs dos----  CBC per surgeon             Lab results------Current EKG in Epic dated 09/02/22. COVID test -----patient states asymptomatic no test needed Arrive at -------0830 NPO after MN NO Solid Food.  Clear liquids from MN until---0730 Med rec completed Medications to take morning of surgery -----Provera, Protonix, Flonase and Zyrtec. Diabetic medication ----- Patient instructed no nail polish to be worn day of surgery Patient instructed to bring photo id and insurance card day of surgery Patient aware to have Driver (ride ) / caregiver Husband Catherine [name]   for 24 hours after surgery  Patient Special Instructions ----- Pre-Op special Instructions ----- Pt takes Wegovy last dose 08/15/22, patient stated she is planning on holding medication until after surgery. Patient verbalized understanding of instructions that were given at this phone interview. Patient denies shortness of breath, chest pain, fever, cough at this phone interview.

## 2022-09-15 ENCOUNTER — Encounter (INDEPENDENT_AMBULATORY_CARE_PROVIDER_SITE_OTHER): Payer: Self-pay | Admitting: Family Medicine

## 2022-09-15 ENCOUNTER — Ambulatory Visit (INDEPENDENT_AMBULATORY_CARE_PROVIDER_SITE_OTHER): Payer: 59 | Admitting: Family Medicine

## 2022-09-15 VITALS — BP 127/81 | HR 81 | Temp 98.4°F | Ht 61.0 in | Wt 226.0 lb

## 2022-09-15 DIAGNOSIS — E638 Other specified nutritional deficiencies: Secondary | ICD-10-CM | POA: Insufficient documentation

## 2022-09-15 DIAGNOSIS — R7303 Prediabetes: Secondary | ICD-10-CM | POA: Diagnosis not present

## 2022-09-15 DIAGNOSIS — E669 Obesity, unspecified: Secondary | ICD-10-CM

## 2022-09-15 DIAGNOSIS — E161 Other hypoglycemia: Secondary | ICD-10-CM | POA: Diagnosis not present

## 2022-09-15 DIAGNOSIS — G4733 Obstructive sleep apnea (adult) (pediatric): Secondary | ICD-10-CM

## 2022-09-15 DIAGNOSIS — Z6841 Body Mass Index (BMI) 40.0 and over, adult: Secondary | ICD-10-CM

## 2022-09-15 NOTE — Progress Notes (Signed)
[facility]  Office: [phone]  /  Fax: [phone]  WEIGHT SUMMARY AND BIOMETRICS  Anthropometric Measurements Height: 5\' 1"  (1.549 m) Weight: 226 lb (102.5 kg) BMI (Calculated): 42.72 Weight at Last Visit: 226 lb Starting Weight: 226 lb Total Weight Loss (lbs): 0 lb (0 kg)   Body Composition  Body Fat %: 50.6 % Fat Mass (lbs): 114.6 lbs Muscle Mass (lbs): 106.2 lbs Total Body Water (lbs): 81.8 lbs Visceral Fat Rating : 17   Other Clinical Data Today's Visit #: 2 Starting Date: 09/02/22    Chief Complaint: OBESITY  Catherine [name] is here to discuss her progress with her obesity treatment plan. She is on the the Category 2 Plan and states she is following her eating plan approximately 25 % of the time. She states she is exercising 0 minutes 0 times per week although she did some walking on the beach while on vacation.  Discussed the use of AI scribe software for clinical note transcription with the patient, who gave verbal consent to proceed.  History of Present Illness   The patient, with a history of severe sleep apnea and prediabetes, presents for a follow-up consultation after a vacation. They report maintaining their weight during the vacation, attributing this to home-cooked meals. However, they acknowledge the challenge of cooking with measured amounts of olive oil due to its high caloric content. They express interest in using a low-calorie olive oil spray as an alternative.  The patient has been using a CPAP machine for approximately 4-6 weeks for their severe sleep apnea. They report discomfort and difficulty sleeping with the device, often removing it during the night due to noise from pressure changes. They plan to request a different mask, suspecting the current one may not fit correctly.  Regarding their prediabetes, the patient's recent lab results show an elevated insulin level, indicating their pancreas is working harder than normal to maintain blood sugar control. They  acknowledge the need to adjust their diet to manage this condition, particularly focusing on reducing simple carbohydrates and increasing protein intake.  The patient has been off Wegovy, a medication previously used for weight management, for about four weeks. They report struggling with portion control, particularly during dinner. They express willingness to try different food options and portion sizes to better manage their hunger and cravings. They also plan to incorporate more lean meats and vegetables into their meals.  The patient's upcoming plans include a medical procedure, which has influenced their decision to temporarily discontinue Wegovy. They plan to reassess their use of the medication after the procedure and based on their ability to manage their diet in the coming weeks.          PHYSICAL EXAM:  Blood pressure 127/81, pulse 81, temperature 98.4 F (36.9 C), height 5\' 1"  (1.549 m), weight 226 lb (102.5 kg), last menstrual period 04/02/2018, SpO2 96 %. Body mass index is 42.7 kg/m.  DIAGNOSTIC DATA REVIEWED:  BMET    Component Value Date/Time   NA 142 09/02/2022 0844   K 4.4 09/02/2022 0844   CL 105 09/02/2022 0844   CO2 18 (L) 09/02/2022 0844   GLUCOSE 88 09/02/2022 0844   BUN 11 09/02/2022 0844   CREATININE 0.75 09/02/2022 0844   CALCIUM 9.2 09/02/2022 0844   Lab Results  Component Value Date   HGBA1C 6.1 (H) 09/02/2022   Lab Results  Component Value Date   INSULIN 28.7 (H) 09/02/2022   Lab Results  Component Value Date   TSH 3.080 09/02/2022  CBC    Component Value Date/Time   WBC 9.6 09/02/2022 0844   WBC 9.0 07/27/2013 0940   RBC 5.00 09/02/2022 0844   RBC 4.96 07/27/2013 0940   HGB 13.2 09/02/2022 0844   HCT 42.0 09/02/2022 0844   PLT 297 09/02/2022 0844   MCV 84 09/02/2022 0844   MCH 26.4 (L) 09/02/2022 0844   MCH 28.2 07/27/2013 0940   MCHC 31.4 (L) 09/02/2022 0844   MCHC 33.3 07/27/2013 0940   RDW 15.0 09/02/2022 0844   Iron  Studies No results found for: "IRON", "TIBC", "FERRITIN", "IRONPCTSAT" Lipid Panel     Component Value Date/Time   CHOL 136 09/02/2022 0844   TRIG 80 09/02/2022 0844   HDL 47 09/02/2022 0844   LDLCALC 73 09/02/2022 0844   Hepatic Function Panel     Component Value Date/Time   PROT 6.9 09/02/2022 0844   ALBUMIN 4.3 09/02/2022 0844   AST 14 09/02/2022 0844   ALT 12 09/02/2022 0844   ALKPHOS 87 09/02/2022 0844   BILITOT 0.3 09/02/2022 0844      Component Value Date/Time   TSH 3.080 09/02/2022 0844   Nutritional Lab Results  Component Value Date   VD25OH 57.7 09/02/2022     Assessment and Plan    Prediabetes: Hemoglobin A1c at 6.1, indicating prediabetes. Discussed the impact of diet on blood sugar control and insulin levels. Patient has been struggling with consistent dietary adherence. -Continue dietary modifications focusing on protein intake and reducing simple carbohydrates. -Reevaluate use of Wegovy in 2 weeks, considering patient's ability to consume adequate nutrition.  Hyperinsulinemia: Fasting insulin level significantly elevated at 28. Discussed the impact of hyperinsulinemia on weight gain and hunger. -Continue dietary modifications focusing on protein intake and reducing simple carbohydrates.  Sleep Apnea: Patient struggling with CPAP mask fit and adherence. -Recommend patient to follow up with sleep specialist to adjust CPAP mask fit.  Nutritional Deficiencies: Folic acid on the low end of normal, indicating potential inadequate vegetable intake. Vitamin D and B12 within normal limits. -Encourage increased vegetable intake. -Continue current dietary habits for maintaining Vitamin D and B12 levels.  Follow-up in 2 weeks to assess dietary adherence, hunger levels, and potential adjustments to the plan.         I have personally spent 44 minutes total time today in preparation, patient care, and documentation for this visit, including the following: review  of clinical lab tests; review of medical tests/procedures/services.    She was informed of the importance of frequent follow up visits to maximize her success with intensive lifestyle modifications for her multiple health conditions. No follow-ups on file.   Quillian Quince, MD

## 2022-09-22 ENCOUNTER — Other Ambulatory Visit (HOSPITAL_BASED_OUTPATIENT_CLINIC_OR_DEPARTMENT_OTHER): Payer: Self-pay | Admitting: Obstetrics & Gynecology

## 2022-09-23 ENCOUNTER — Ambulatory Visit (HOSPITAL_BASED_OUTPATIENT_CLINIC_OR_DEPARTMENT_OTHER): Payer: 59 | Admitting: Anesthesiology

## 2022-09-23 ENCOUNTER — Other Ambulatory Visit: Payer: Self-pay

## 2022-09-23 ENCOUNTER — Ambulatory Visit (HOSPITAL_BASED_OUTPATIENT_CLINIC_OR_DEPARTMENT_OTHER)
Admission: RE | Admit: 2022-09-23 | Discharge: 2022-09-23 | Disposition: A | Discharge status: Home / Self Care | Payer: 59 | Attending: Obstetrics & Gynecology | Admitting: Obstetrics & Gynecology

## 2022-09-23 ENCOUNTER — Encounter (HOSPITAL_BASED_OUTPATIENT_CLINIC_OR_DEPARTMENT_OTHER)
Admission: RE | Disposition: A | Discharge status: Home / Self Care | Payer: Self-pay | Source: Home / Self Care | Attending: Obstetrics & Gynecology

## 2022-09-23 ENCOUNTER — Encounter (HOSPITAL_BASED_OUTPATIENT_CLINIC_OR_DEPARTMENT_OTHER): Payer: Self-pay | Admitting: Obstetrics & Gynecology

## 2022-09-23 DIAGNOSIS — M1990 Unspecified osteoarthritis, unspecified site: Secondary | ICD-10-CM | POA: Diagnosis not present

## 2022-09-23 DIAGNOSIS — Z01818 Encounter for other preprocedural examination: Secondary | ICD-10-CM

## 2022-09-23 DIAGNOSIS — Z6841 Body Mass Index (BMI) 40.0 and over, adult: Secondary | ICD-10-CM | POA: Insufficient documentation

## 2022-09-23 DIAGNOSIS — Z9989 Dependence on other enabling machines and devices: Secondary | ICD-10-CM | POA: Diagnosis not present

## 2022-09-23 DIAGNOSIS — R935 Abnormal findings on diagnostic imaging of other abdominal regions, including retroperitoneum: Secondary | ICD-10-CM | POA: Diagnosis not present

## 2022-09-23 DIAGNOSIS — N950 Postmenopausal bleeding: Secondary | ICD-10-CM | POA: Diagnosis not present

## 2022-09-23 DIAGNOSIS — N840 Polyp of corpus uteri: Secondary | ICD-10-CM

## 2022-09-23 DIAGNOSIS — G4733 Obstructive sleep apnea (adult) (pediatric): Secondary | ICD-10-CM | POA: Diagnosis not present

## 2022-09-23 DIAGNOSIS — G4730 Sleep apnea, unspecified: Secondary | ICD-10-CM | POA: Insufficient documentation

## 2022-09-23 DIAGNOSIS — Z87891 Personal history of nicotine dependence: Secondary | ICD-10-CM | POA: Diagnosis not present

## 2022-09-23 HISTORY — PX: HYSTEROSCOPY WITH D & C: SHX1775

## 2022-09-23 HISTORY — DX: Prediabetes: R73.03

## 2022-09-23 LAB — CBC
HCT: 41.1 % (ref 36.0–46.0)
Hemoglobin: 13 g/dL (ref 12.0–15.0)
MCH: 26.4 pg (ref 26.0–34.0)
MCHC: 31.6 g/dL (ref 30.0–36.0)
MCV: 83.5 fL (ref 80.0–100.0)
Platelets: 269 10*3/uL (ref 150–400)
RBC: 4.92 MIL/uL (ref 3.87–5.11)
RDW: 15.2 % (ref 11.5–15.5)
WBC: 9.5 10*3/uL (ref 4.0–10.5)
nRBC: 0 % (ref 0.0–0.2)

## 2022-09-23 SURGERY — DILATATION AND CURETTAGE /HYSTEROSCOPY
Anesthesia: General | Site: Uterus

## 2022-09-23 MED ORDER — FENTANYL CITRATE (PF) 100 MCG/2ML IJ SOLN
INTRAMUSCULAR | Status: AC
  Filled 2022-09-23: qty 2

## 2022-09-23 MED ORDER — MIDAZOLAM HCL 2 MG/2ML IJ SOLN
INTRAMUSCULAR | Status: DC | PRN
  Administered 2022-09-23: 2 mg via INTRAVENOUS

## 2022-09-23 MED ORDER — KETOROLAC TROMETHAMINE 30 MG/ML IJ SOLN
INTRAMUSCULAR | Status: AC
  Filled 2022-09-23: qty 1

## 2022-09-23 MED ORDER — PROMETHAZINE HCL 25 MG/ML IJ SOLN: 6.2500 mg | INTRAMUSCULAR | Status: DC | PRN

## 2022-09-23 MED ORDER — SODIUM CHLORIDE 0.9 % IR SOLN
Status: DC | PRN
  Administered 2022-09-23: 500 mL

## 2022-09-23 MED ORDER — PROPOFOL 10 MG/ML IV BOLUS
INTRAVENOUS | Status: DC | PRN
  Administered 2022-09-23: 200 mg via INTRAVENOUS
  Administered 2022-09-23: 60 mg via INTRAVENOUS

## 2022-09-23 MED ORDER — HYDROCODONE-ACETAMINOPHEN 5-325 MG PO TABS: 1.0000 | ORAL_TABLET | Freq: Four times a day (QID) | ORAL | 0 refills | Status: DC | PRN

## 2022-09-23 MED ORDER — DEXAMETHASONE SODIUM PHOSPHATE 10 MG/ML IJ SOLN
INTRAMUSCULAR | Status: DC | PRN
  Administered 2022-09-23: 10 mg via INTRAVENOUS

## 2022-09-23 MED ORDER — POVIDONE-IODINE 10 % EX SWAB: 2.0000 | Freq: Once | CUTANEOUS | Status: DC

## 2022-09-23 MED ORDER — DEXMEDETOMIDINE HCL IN NACL 80 MCG/20ML IV SOLN
INTRAVENOUS | Status: DC | PRN
  Administered 2022-09-23: 4 ug via INTRAVENOUS
  Administered 2022-09-23: 8 ug via INTRAVENOUS

## 2022-09-23 MED ORDER — EPHEDRINE 5 MG/ML INJ
INTRAVENOUS | Status: AC
  Filled 2022-09-23: qty 5

## 2022-09-23 MED ORDER — ONDANSETRON HCL 4 MG/2ML IJ SOLN
INTRAMUSCULAR | Status: DC | PRN
  Administered 2022-09-23: 4 mg via INTRAVENOUS

## 2022-09-23 MED ORDER — PROPOFOL 10 MG/ML IV BOLUS
INTRAVENOUS | Status: AC
  Filled 2022-09-23: qty 20

## 2022-09-23 MED ORDER — KETOROLAC TROMETHAMINE 30 MG/ML IJ SOLN
INTRAMUSCULAR | Status: DC | PRN
  Administered 2022-09-23: 30 mg via INTRAVENOUS

## 2022-09-23 MED ORDER — MIDAZOLAM HCL 2 MG/2ML IJ SOLN
INTRAMUSCULAR | Status: AC
  Filled 2022-09-23: qty 2

## 2022-09-23 MED ORDER — FENTANYL CITRATE (PF) 100 MCG/2ML IJ SOLN: 25.0000 ug | INTRAMUSCULAR | Status: DC | PRN

## 2022-09-23 MED ORDER — ACETAMINOPHEN 500 MG PO TABS
ORAL_TABLET | ORAL | Status: AC
  Filled 2022-09-23: qty 2

## 2022-09-23 MED ORDER — LACTATED RINGERS IV SOLN: INTRAVENOUS | Status: DC

## 2022-09-23 MED ORDER — ACETAMINOPHEN 500 MG PO TABS
1000.0000 mg | ORAL_TABLET | Freq: Once | ORAL | Status: AC
  Administered 2022-09-23: 1000 mg via ORAL

## 2022-09-23 MED ORDER — LIDOCAINE-EPINEPHRINE 1 %-1:100000 IJ SOLN
INTRAMUSCULAR | Status: DC | PRN
  Administered 2022-09-23: 10 mL

## 2022-09-23 MED ORDER — LIDOCAINE 2% (20 MG/ML) 5 ML SYRINGE
INTRAMUSCULAR | Status: DC | PRN
  Administered 2022-09-23: 50 mg via INTRAVENOUS

## 2022-09-23 MED ORDER — IBUPROFEN 800 MG PO TABS: 800.0000 mg | ORAL_TABLET | Freq: Three times a day (TID) | ORAL | 0 refills | Status: DC | PRN

## 2022-09-23 MED ORDER — FENTANYL CITRATE (PF) 250 MCG/5ML IJ SOLN
INTRAMUSCULAR | Status: DC | PRN
  Administered 2022-09-23: 50 ug via INTRAVENOUS

## 2022-09-23 SURGICAL SUPPLY — 20 items
CATH ROBINSON RED A/P 16FR (CATHETERS) ×1 IMPLANT
DEVICE MYOSURE LITE (MISCELLANEOUS) IMPLANT
DEVICE MYOSURE REACH (MISCELLANEOUS) IMPLANT
DILATOR CANAL MILEX (MISCELLANEOUS) IMPLANT
DRSG TELFA 3X8 NADH STRL (GAUZE/BANDAGES/DRESSINGS) ×1 IMPLANT
GAUZE 4X4 16PLY ~~LOC~~+RFID DBL (SPONGE) ×2 IMPLANT
GLOVE BIOGEL PI IND STRL 7.0 (GLOVE) ×1 IMPLANT
GLOVE ECLIPSE 6.5 STRL STRAW (GLOVE) ×2 IMPLANT
GOWN STRL REUS W/TWL LRG LVL3 (GOWN DISPOSABLE) ×2 IMPLANT
IV NS IRRIG 3000ML ARTHROMATIC (IV SOLUTION) ×1 IMPLANT
KIT PROCEDURE FLUENT (KITS) ×1 IMPLANT
KIT TURNOVER CYSTO (KITS) ×1 IMPLANT
LOOP CUTTING BIPOLAR 21FR (ELECTRODE) IMPLANT
PACK VAGINAL MINOR WOMEN LF (CUSTOM PROCEDURE TRAY) ×1 IMPLANT
PAD OB MATERNITY 4.3X12.25 (PERSONAL CARE ITEMS) ×1 IMPLANT
SEAL CERVICAL OMNI LOK (ABLATOR) IMPLANT
SEAL ROD LENS SCOPE MYOSURE (ABLATOR) ×1 IMPLANT
SLEEVE SCD COMPRESS KNEE MED (STOCKING) ×1 IMPLANT
TOWEL OR 17X24 6PK STRL BLUE (TOWEL DISPOSABLE) ×1 IMPLANT
WATER STERILE IRR 500ML POUR (IV SOLUTION) ×1 IMPLANT

## 2022-09-23 NOTE — H&P (Signed)
Catherine [name] is an 62 y.o. female G4P2 MWF here for hysteroscopy and possible polyp resection due to PMP bleeding.  Pt has hx of endometrial hyperplasia without atypica in 2015 and was treated with cyclic progesterone and then daily progesterone.  With most recent bleeding, endometrial biopsy was done showing inactive endometrium on 08/20/2021.  Marland Kitchen  Ultrasound 08/20/2021 showed endometrium of 2.92mm.  Repeat biopsy was obtained 06/11/2022 showing weakly proliferative to atrophic endometrium with focal pseudodecidualization suggestive of steroid effect in the background of abundant blood.  Ultrasound was repeated in April, 2023 and endometrium was 4.23m but 7mm echogenic area with feeder vessel was noted.  Last pap was 06/11/2022.    Options for treatment has been discussed and she has decided at this time to proceed with hysteroscopy with possible polyp resection, dilation and curettage for treatment at this time.  She is not desirous of more definitive treatment at this time but would consider if has abnormal pathology or repeat bleeding in the future.    Risks, benefits have been discussed with pt and documented in 08/05/2022 note.     Pertinent Gynecological History: Menses: post-menopausal Bleeding: post menopausal bleeding Contraception: post menopausal status DES exposure: denies Blood transfusions: none Sexually transmitted diseases: no past history Previous GYN Procedures:  hysteroscopy with D&C in 2015   Last mammogram: normal Date: 05/12/2022 Last pap: normal Date: 06/11/2022 OB History: G4, P2   Menstrual History: Patient's last menstrual period was 04/02/2018.    Past Medical History:  Diagnosis Date   Acid reflux    Pre-diabetes    Sleep apnea     Past Surgical History:  Procedure Laterality Date   CARPAL TUNNEL RELEASE Left 2020   DILATATION & CURETTAGE/HYSTEROSCOPY WITH TRUECLEAR N/A 07/27/2013   Procedure: DILATATION & CURETTAGE/HYSTEROSCOPY WITH TRUCLEAR;  Surgeon: Bennye Alm, MD;  Location: [facility];  Service: Gynecology;  Laterality: N/A;   ENDOMETRIAL BIOPSY  04/07/2013   neg    Family History  Problem Relation Age of Onset   Obesity Mother    Breast cancer Mother    Cancer Mother        lung   Obesity Father    Hyperlipidemia Father    Cancer Father        pancreatic   Heart disease Father    Diabetes Father    Sleep apnea Brother    Cancer Maternal Grandmother    Cancer Paternal Grandmother        pancreatic    Social History:  reports that she has quit smoking. She has never used smokeless tobacco. She reports that she does not drink alcohol and does not use drugs.  Allergies: No Known Allergies  No medications prior to admission.    Review of Systems  Constitutional: Negative.   Genitourinary:        Postmenopausal bleeding    Last menstrual period 04/02/2018. Physical Exam Constitutional:      Appearance: Normal appearance.  Cardiovascular:     Rate and Rhythm: Normal rate and regular rhythm.  Pulmonary:     Effort: Pulmonary effort is normal.     Breath sounds: Normal breath sounds.  Neurological:     General: No focal deficit present.     Mental Status: She is alert.  Psychiatric:        Mood and Affect: Mood normal.    No results found for this or any previous visit (from the past 24 hour(s)).  No results found.  Assessment/Plan: 62 yo  G4P2 MWF with recurrent PMP bleeding here for hysteroscopy with possible polyp resection, D&C.  Questions answered.  Pt here and ready to proceed.  [name] 09/23/2022, 7:19 AM

## 2022-09-23 NOTE — Transfer of Care (Signed)
Immediate Anesthesia Transfer of Care Note  Patient: Catherine [name]  Procedure(s) Performed: DILATATION AND CURETTAGE /HYSTEROSCOPY (Uterus)  Patient Location: PACU  Anesthesia Type:General  Level of Consciousness: sedated  Airway & Oxygen Therapy: Patient Spontanous Breathing and Patient connected to face mask oxygen  Post-op Assessment: Report given to RN and Post -op Vital signs reviewed and stable  Post vital signs: Reviewed and stable  Last Vitals:  Vitals Value Taken Time  BP 114/63 09/23/22 1058  Temp    Pulse 82 09/23/22 1100  Resp 16 09/23/22 1100  SpO2 94 % 09/23/22 1100  Vitals shown include unvalidated device data.  Last Pain:  Vitals:   09/23/22 0923  TempSrc: Oral  PainSc: 0-No pain      Patients Stated Pain Goal: 6 (09/23/22 1610)  Complications: No notable events documented.

## 2022-09-23 NOTE — Op Note (Signed)
09/23/2022  10:49 AM  PATIENT:  Catherine [name]  62 y.o. female  PRE-OPERATIVE DIAGNOSIS:  PMB  Polyp  POST-OPERATIVE DIAGNOSIS:  PMB  PROCEDURE:  Procedure(s): DILATATION AND CURETTAGE /HYSTEROSCOPY  SURGEON:  [name]  ASSISTANTS: OR staff.    ANESTHESIA:   general  ESTIMATED BLOOD LOSS: 10cc  BLOOD ADMINISTERED:none   FLUIDS: 400ccLR  UOP: pt voided before going back to the OR  SPECIMEN:  endometrial curettings  DISPOSITION OF SPECIMEN:  PATHOLOGY  FINDINGS: normal appearing endometrial cavity, no polyps noted, normal tubal ostia present  DESCRIPTION OF OPERATION: Patient was taken to the operating room.  She is placed in the supine position. SCDs were on her lower extremities and functioning properly. General anesthesia with an LMA was administered without difficulty. Dr. Cristela Felt, anesthesia, oversaw case.  Legs were then placed in the [facility] stirrups in the low lithotomy position. The legs were lifted to the high lithotomy position and the Betadine prep was used on the inner thighs perineum and vagina x3. Patient was draped in a normal standard fashion. A bivalve speculum was placed the vagina. The anterior lip of the cervix was grasped with single-tooth tenaculum.  A paracervical block of 1% lidocaine mixed one-to-one with epinephrine (1:100,000 units).  10 cc was used total. The cervix is dilated up to #21 [facility] dilators. The endometrial cavity sounded to 8 cm.   A 5.29mm diagnostic hysteroscope was obtained. Normal saline was used as a hysteroscopic fluid. The hysteroscope was advanced through the endocervical canal into the endometrial cavity. The tubal ostia were noted bilaterally. Endometrium appeared normal.  No polyps or abnormal lesions were present.  The hysteroscope was removed. A #1 toothed curette was used to curette the cavity until rough gritty texture was noted in all quadrants. With revisualization of the hysteroscope and cavity did have the appearance of  irregular looking endometrium consistent with curettage.  At this point no other procedure was needed and this procedure was ended. The hysteroscope was removed. The fluid deficit was 55 cc. The tenaculum was removed from the anterior lip of the cervix. The speculum was removed from the vagina. The prep was cleansed of the patient's skin. The legs are positioned back in the supine position. Sponge, lap, needle, instrument counts were correct x2. Patient was taken to recovery in stable condition.  COUNTS:  YES  PLAN OF CARE: Transfer to PACU

## 2022-09-23 NOTE — Discharge Instructions (Addendum)
Post-surgical Instructions, Outpatient Surgery  You may expect to feel dizzy, weak, and drowsy for as long as 24 hours after receiving the medicine that made you sleep (anesthetic). For the first 24 hours after your surgery:   Do not drive a car, ride a bicycle, participate in physical activities, or take public transportation until you are done taking narcotic pain medicines or as directed by Dr. Hyacinth Meeker.  Do not drink alcohol or take tranquilizers.  Do not take medicine that has not been prescribed by your physicians.  Do not sign important papers or make important decisions while on narcotic pain medicines.  Have a responsible person with you.   PAIN MANAGEMENT Motrin 800mg .  (This is the same as 4-200mg  over the counter tablets of Motrin or ibuprofen.)  You may take this every eight hours or as needed for cramping.   Vicodin 5/325mg .  For more severe pain, take one or two tablets every four to six hours as needed for pain control.  (Remember that narcotic pain medications increase your risk of constipation.  If this becomes a problem, you may take an over the counter stool softener like Colace 100mg  up to four times a day.)  DO'S AND DON'T'S Do not take a tub bath for one week.  You may shower on the first day after your surgery Do not do any heavy lifting for one to two weeks.  This increases the chance of bleeding. Do move around as you feel able.  Stairs are fine.  You may begin to exercise again as you feel able.  Do not lift any weights for two weeks. Do not put anything in the vagina for two weeks--no tampons, intercourse, or douching.    REGULAR MEDIATIONS/VITAMINS: You may restart all of your regular medications as prescribed. You may restart all of your vitamins as you normally take them.    PLEASE CALL OR SEEK MEDICAL CARE IF: You have persistent nausea and vomiting.  You have trouble eating or drinking.  You have an oral temperature above 100.5.  You have constipation that is  not helped by adjusting diet or increasing fluid intake. Pain medicines are a common cause of constipation.  You have heavy vaginal bleeding   Post Anesthesia Home Care Instructions  Activity: Get plenty of rest for the remainder of the day. A responsible individual must stay with you for 24 hours following the procedure.  For the next 24 hours, DO NOT: -Drive a car -Advertising copywriter -Drink alcoholic beverages -Take any medication unless instructed by your physician -Make any legal decisions or sign important papers.  Meals: Start with liquid foods such as gelatin or soup. Progress to regular foods as tolerated. Avoid greasy, spicy, heavy foods. If nausea and/or vomiting occur, drink only clear liquids until the nausea and/or vomiting subsides. Call your physician if vomiting continues.  Special Instructions/Symptoms: Your throat may feel dry or sore from the anesthesia or the breathing tube placed in your throat during surgery. If this causes discomfort, gargle with warm salt water. The discomfort should disappear within 24 hours.  No acetaminophen/Tylenol until after 3 pm today if needed. No ibuprofen, Advil, Aleve, Motrin, ketorolac, meloxicam, naproxen, or other NSAIDS until after 4:45 pm today if needed.

## 2022-09-23 NOTE — Anesthesia Procedure Notes (Signed)
Procedure Name: LMA Insertion Date/Time: 09/23/2022 10:26 AM  Performed by: Dairl Ponder, CRNAPre-anesthesia Checklist: Patient identified, Emergency Drugs available, Suction available and Patient being monitored Patient Re-evaluated:Patient Re-evaluated prior to induction Oxygen Delivery Method: Circle System Utilized Preoxygenation: Pre-oxygenation with 100% oxygen Induction Type: IV induction Ventilation: Mask ventilation without difficulty LMA: LMA inserted LMA Size: 4.0 Number of attempts: 1 Airway Equipment and Method: Bite block Placement Confirmation: positive ETCO2 Tube secured with: Tape Dental Injury: Teeth and Oropharynx as per pre-operative assessment

## 2022-09-23 NOTE — Anesthesia Preprocedure Evaluation (Signed)
Anesthesia Evaluation  Patient identified by MRN, date of birth, ID band Patient awake    Reviewed: Allergy & Precautions, NPO status , Patient's Chart, lab work & pertinent test results  Airway Mallampati: I  TM Distance: >3 FB Neck ROM: Full    Dental  (+) Teeth Intact, Dental Advisory Given   Pulmonary sleep apnea and Continuous Positive Airway Pressure Ventilation , former smoker   Pulmonary exam normal breath sounds clear to auscultation       Cardiovascular negative cardio ROS Normal cardiovascular exam Rhythm:Regular Rate:Normal     Neuro/Psych negative neurological ROS     GI/Hepatic Neg liver ROS,GERD  Medicated,,  Endo/Other    Morbid obesity  Renal/GU negative Renal ROS     Musculoskeletal  (+) Arthritis ,    Abdominal   Peds  Hematology negative hematology ROS (+)   Anesthesia Other Findings Day of surgery medications reviewed with the patient.  Reproductive/Obstetrics                             Anesthesia Physical Anesthesia Plan  ASA: 3  Anesthesia Plan: General   Post-op Pain Management: Tylenol PO (pre-op)*   Induction: Intravenous  PONV Risk Score and Plan: 3 and Midazolam, Dexamethasone and Ondansetron  Airway Management Planned: LMA  Additional Equipment:   Intra-op Plan:   Post-operative Plan: Extubation in OR  Informed Consent: I have reviewed the patients History and Physical, chart, labs and discussed the procedure including the risks, benefits and alternatives for the proposed anesthesia with the patient or authorized representative who has indicated his/her understanding and acceptance.     Dental advisory given  Plan Discussed with: CRNA  Anesthesia Plan Comments:        Anesthesia Quick Evaluation

## 2022-09-23 NOTE — Anesthesia Postprocedure Evaluation (Signed)
Anesthesia Post Note  Patient: Catherine [name]  Procedure(s) Performed: DILATATION AND CURETTAGE /HYSTEROSCOPY (Uterus)     Patient location during evaluation: PACU Anesthesia Type: General Level of consciousness: awake and alert Pain management: pain level controlled Vital Signs Assessment: post-procedure vital signs reviewed and stable Respiratory status: spontaneous breathing, nonlabored ventilation, respiratory function stable and patient connected to nasal cannula oxygen Cardiovascular status: blood pressure returned to baseline and stable Postop Assessment: no apparent nausea or vomiting Anesthetic complications: no   No notable events documented.  Last Vitals:  Vitals:   09/23/22 1131 09/23/22 1153  BP:  129/83  Pulse:  72  Resp:  16  Temp:  36.7 C  SpO2: 94% 94%    Last Pain:  Vitals:   09/23/22 1153  TempSrc:   PainSc: 0-No pain                 Collene Schlichter

## 2022-09-24 ENCOUNTER — Encounter (HOSPITAL_BASED_OUTPATIENT_CLINIC_OR_DEPARTMENT_OTHER): Payer: Self-pay | Admitting: Obstetrics & Gynecology

## 2022-09-24 LAB — SURGICAL PATHOLOGY

## 2022-09-25 ENCOUNTER — Ambulatory Visit (INDEPENDENT_AMBULATORY_CARE_PROVIDER_SITE_OTHER): Payer: 59 | Admitting: Family Medicine

## 2022-09-30 ENCOUNTER — Encounter (INDEPENDENT_AMBULATORY_CARE_PROVIDER_SITE_OTHER): Payer: Self-pay | Admitting: Family Medicine

## 2022-09-30 ENCOUNTER — Ambulatory Visit (INDEPENDENT_AMBULATORY_CARE_PROVIDER_SITE_OTHER): Payer: 59 | Admitting: Family Medicine

## 2022-09-30 VITALS — BP 124/76 | HR 77 | Temp 98.0°F | Ht 61.0 in | Wt 221.0 lb

## 2022-09-30 DIAGNOSIS — Z6841 Body Mass Index (BMI) 40.0 and over, adult: Secondary | ICD-10-CM | POA: Diagnosis not present

## 2022-09-30 DIAGNOSIS — R7303 Prediabetes: Secondary | ICD-10-CM | POA: Diagnosis not present

## 2022-09-30 DIAGNOSIS — E669 Obesity, unspecified: Secondary | ICD-10-CM

## 2022-10-01 NOTE — Progress Notes (Unsigned)
Chief Complaint:   OBESITY Catherine [name] is here to discuss her progress with her obesity treatment plan along with follow-up of her obesity related diagnoses. Janeya is on the Category 2 Plan and states she is following her eating plan approximately 75% of the time. [place] states she is doing 0 minutes 0 times per week.  Today's visit was #: 3 Starting weight: 226 lbs Starting date: 09/02/2022 Today's weight: 221 lbs Today's date: 09/30/2022 Total lbs lost to date: 5 Total lbs lost since last in-office visit: 5  Interim History: Patient has done well with her weight loss.  She struggles the most at dinner due to the volume of the food.  She did some celebration eating, but she got back on track quickly.  Subjective:   1. Prediabetes Patient's recent A1c was 6.1.  She is doing well with modifying her diet and with her weight loss, which should help decrease the risk of diabetes mellitus.  Assessment/Plan:   1. Prediabetes Patient is to continue with her diet and exercise, and we will recheck labs in 1 month.  2. BMI 40.0-44.9, adult (HCC)  3. Obesity, Beginning BMI 42.8 Taesha is currently in the action stage of change. As such, her goal is to continue with weight loss efforts. She has agreed to the Category 2 Plan and keeping a food journal and adhering to recommended goals of 400-550 calories and 35+ grams of protein at supper daily.   Behavioral modification strategies: increasing lean protein intake.  Bethanie has agreed to follow-up with our clinic in 2 weeks. She was informed of the importance of frequent follow-up visits to maximize her success with intensive lifestyle modifications for her multiple health conditions.   Objective:   Blood pressure 124/76, pulse 77, temperature 98 F (36.7 C), height 5\' 1"  (1.549 m), weight 221 lb (100.2 kg), last menstrual period 04/02/2018, SpO2 93 %. Body mass index is 41.76 kg/m.  Lab Results  Component Value Date   CREATININE 0.75 09/02/2022    BUN 11 09/02/2022   NA 142 09/02/2022   K 4.4 09/02/2022   CL 105 09/02/2022   CO2 18 (L) 09/02/2022   Lab Results  Component Value Date   ALT 12 09/02/2022   AST 14 09/02/2022   ALKPHOS 87 09/02/2022   BILITOT 0.3 09/02/2022   Lab Results  Component Value Date   HGBA1C 6.1 (H) 09/02/2022   Lab Results  Component Value Date   INSULIN 28.7 (H) 09/02/2022   Lab Results  Component Value Date   TSH 3.080 09/02/2022   Lab Results  Component Value Date   CHOL 136 09/02/2022   HDL 47 09/02/2022   LDLCALC 73 09/02/2022   TRIG 80 09/02/2022   Lab Results  Component Value Date   VD25OH 57.7 09/02/2022   Lab Results  Component Value Date   WBC 9.5 09/23/2022   HGB 13.0 09/23/2022   HCT 41.1 09/23/2022   MCV 83.5 09/23/2022   PLT 269 09/23/2022   No results found for: "IRON", "TIBC", "FERRITIN"  Attestation Statements:   Reviewed by clinician on day of visit: allergies, medications, problem list, medical history, surgical history, family history, social history, and previous encounter notes.  Time spent on visit including pre-visit chart review and post-visit care and charting was 30 minutes.   I, Burt Knack, am acting as transcriptionist for Quillian Quince, MD.  I have reviewed the above documentation for accuracy and completeness, and I agree with the above. -  Lache Dagher  Leafy Ro, MD

## 2022-10-08 ENCOUNTER — Encounter: Payer: Self-pay | Admitting: Neurology

## 2022-10-08 ENCOUNTER — Ambulatory Visit: Payer: 59 | Admitting: Neurology

## 2022-10-08 VITALS — BP 135/88 | HR 76 | Ht 61.0 in | Wt 222.8 lb

## 2022-10-08 DIAGNOSIS — G4733 Obstructive sleep apnea (adult) (pediatric): Secondary | ICD-10-CM

## 2022-10-08 DIAGNOSIS — Z789 Other specified health status: Secondary | ICD-10-CM | POA: Diagnosis not present

## 2022-10-08 NOTE — Progress Notes (Signed)
Subjective:    Patient ID: Catherine [name] is a 62 y.o. female.  HPI    Interim history:   Catherine [name] is a 62 year old female with an underlying medical history of allergies, arthritis, reflux disease, carpal tunnel syndrome, arthritis, and morbid obesity with a BMI of over 45, who presents for follow-up consultation (obstructive sleep apnea after interim testing and starting home AutoPap therapy.  The patient is unaccompanied today.  I first met her at the request of her primary care physician on 06/16/2022, at which time she reported a prior diagnosis of obstructive sleep apnea, she is no longer on PAP therapy.  Testing was many years ago.  She was advised to proceed with a sleep study.  She had a home sleep test on 07/08/2022 which showed severe obstructive sleep apnea with an AHI of 32.3/h, O2 nadir 71% with time below or at 88% saturation of over 15 minutes.  Snoring was mild to moderate.  She was advised to proceed with home AutoPap therapy.  Her set up date was 08/01/2022.  She has a ResMed air sense 11 AutoSet machine.  Her DME company is [facility].   Today, 10/08/2022: I reviewed her AutoPap compliance data from 08/02/2022 through 08/31/2022, which is a total of 30 days, during which time she used her machine every night with percent use days greater than 4 hours at 97%, indicating excellent compliance with an average usage of 4 hours and 48 minutes, residual AHI at goal at 3.9/h, 95th percentile of pressure at 12.4 cm with a range of 7 to 13 cm with of 3.  Leak on the higher side with the 95th percentile at 20.3 L/min.  In the past month, between 09/07/2022 and 10/06/2022 her average usage has declined to less than 4 hours, percent use days greater than 4 hours at 33% only.  She reports still adjusting to treatment, she is used to sleeping on her stomach and it is harder for her to sleep on her back.  She would like to try different mask.  She does use a fullface mask but is not sure about the exact name  of the mask.  She admits that she has reduced her usage a little bit since she was compliant in the first month.  She is working on weight loss and is followed by weight management clinic.  Her Epworth sleepiness score is 7 out of 24, previously was 18 out of 24.  She has motivated to continue with treatment.  The patient's allergies, current medications, family history, past medical history, past social history, past surgical history and problem list were reviewed and updated as appropriate.    Previously:   06/16/22: (She) was previously diagnosed with obstructive sleep apnea and placed on PAP therapy.  She no longer uses her PAP machine.  She reports snoring and excessive daytime somnolence.  Her Epworth sleepiness score is 18 out of 24, fatigue severity score is 47 out of 63.  I reviewed your office note from 05/19/2022.  As she recalls, she had moderate sleep apnea, testing was about 10+ years ago.  She had difficulty tolerating the pressure and the mask at the time, she recalls using a fullface mask and may be trying a different type of machine after the first 1 was not the correct 1.  She reports that her brother has sleep apnea.  She is working on weight loss and started [employer] about a month ago.  She is married and lives with her husband.  She recently retired in January 2024.  They have grown children.  They have no pets in the household.  Bedtime is generally between 10 and 10:30 PM and rise time between 7 and 7:30 AM.  She has nocturia about twice per average night, denies recurrent nocturnal or morning headaches.  She drinks caffeine in the form of coffee, about 2 cups of coffee in the morning and 1 cup of tea during the day.  She has gained weight over time.  They have a TV in the bedroom but it is rarely on at night.  She is a non-smoker and does not currently drink any alcohol.  She has carpal tunnel release surgery pending for later this week on the right side.  She had the left side operated  on about 3 years ago.    Her Past Medical History Is Significant For: Past Medical History:  Diagnosis Date   Acid reflux    Pre-diabetes    Sleep apnea     Her Past Surgical History Is Significant For: Past Surgical History:  Procedure Laterality Date   CARPAL TUNNEL RELEASE Left 2020   DILATATION & CURETTAGE/HYSTEROSCOPY WITH TRUECLEAR N/A 07/27/2013   Procedure: DILATATION & CURETTAGE/HYSTEROSCOPY WITH TRUCLEAR;  Surgeon: Bennye Alm, MD;  Location: [facility];  Service: Gynecology;  Laterality: N/A;   ENDOMETRIAL BIOPSY  04/07/2013   neg   HYSTEROSCOPY WITH D & C N/A 09/23/2022   Procedure: DILATATION AND CURETTAGE /HYSTEROSCOPY;  Surgeon: [name], MD;  Location: [facility];  Service: Gynecology;  Laterality: N/A;    Her Family History Is Significant For: Family History  Problem Relation Age of Onset   Obesity Mother    Breast cancer Mother    Cancer Mother        lung   Obesity Father    Hyperlipidemia Father    Cancer Father        pancreatic   Heart disease Father    Diabetes Father    Sleep apnea Brother    Cancer Maternal Grandmother    Cancer Paternal Grandmother        pancreatic    Her Social History Is Significant For: Social History   Socioeconomic History   Marital status: Married    Spouse name: Not on file   Number of children: Not on file   Years of education: Not on file   Highest education level: Not on file  Occupational History   Not on file  Tobacco Use   Smoking status: Former   Smokeless tobacco: Never   Tobacco comments:    As a teen  Building services engineer Use: Never used  Substance and Sexual Activity   Alcohol use: No   Drug use: No   Sexual activity: Yes    Partners: Male    Birth control/protection: Post-menopausal    Comment: husband vasectomy  Other Topics Concern   Not on file  Social History Narrative   Caffeine: maybe 3 cups/day (coffee in AM and then maybe tea)   Right handed   Social  Determinants of Health   Financial Resource Strain: Not on file  Food Insecurity: Not on file  Transportation Needs: Not on file  Physical Activity: Not on file  Stress: Not on file  Social Connections: Not on file    Her Allergies Are:  No Known Allergies:   Her Current Medications Are:  Outpatient Encounter Medications as of 10/08/2022  Medication Sig   cetirizine (ZYRTEC)  10 MG tablet Take 10 mg by mouth daily.   fluticasone (FLONASE) 50 MCG/ACT nasal spray Place 2 sprays into both nostrils daily.   medroxyPROGESTERone (PROVERA) 10 MG tablet TAKE 1 TABLET BY MOUTH EVERY DAY   pantoprazole (PROTONIX) 40 MG tablet Take 40 mg by mouth daily.   WEGOVY 1 MG/0.5ML SOAJ    No facility-administered encounter medications on file as of 10/08/2022.  :  Review of Systems:  Out of a complete 14 point review of systems, all are reviewed and negative with the exception of these symptoms as listed below:  Review of Systems  Neurological:        Pt in room 5, alone. Here for inital cpap follow up. Pt reports she doesn't like cpap, hard to fall asleep with mask. But states she uses at least 4 hours every night. ESS:7 FSS:25     Objective:  Neurological Exam  Physical Exam Physical Examination:   Vitals:   10/08/22 1030 10/08/22 1035  BP: (!) 149/96 135/88  Pulse: 80 76    General Examination: The patient is a very pleasant 62 y.o. female in no acute distress. She appears well-developed and well-nourished and well groomed.   HEENT: Normocephalic, atraumatic, pupils are equal, round and reactive to light, tracking well-preserved, face is symmetric with normal facial animation.  Hearing grossly intact.  Speech is clear without dysarthria, hypophonia or voice tremor.  Neck with full range of motion, no carotid bruits.   Airway examination reveals mild to moderate mouth dryness, tongue protrudes centrally and palate elevates symmetrically.     Chest: Clear to auscultation without wheezing,  rhonchi or crackles noted.   Heart: S1+S2+0, regular and normal without murmurs, rubs or gallops noted.    Abdomen: Soft, non-tender and non-distended.   Extremities: There is no obvious swelling in the distal lower extremities bilaterally.    Skin: Warm and dry without trophic changes noted.    Musculoskeletal: exam reveals no obvious joint deformities.    Neurologically:  Mental status: The patient is awake, alert and oriented in all 4 spheres. Her immediate and remote memory, attention, language skills and fund of knowledge are appropriate. There is no evidence of aphasia, agnosia, apraxia or anomia. Speech is clear with normal prosody and enunciation. Thought process is linear. Mood is normal and affect is normal.  Cranial nerves II - XII are as described above under HEENT exam.  Motor exam: Normal bulk, moving all 4 extremities without limitation, no obvious action or resting tremor.  Fine motor skills and coordination: grossly intact.  Cerebellar testing: No dysmetria or intention tremor. There is no truncal or gait ataxia.  Sensory exam: intact to light touch in the upper and lower extremities.  Gait, station and balance: She stands easily. No veering to one side is noted. No leaning to one side is noted. Posture is age-appropriate and stance is narrow based. Gait shows normal stride length and normal pace. No problems turning are noted.    Assessment and Plan:  In summary, Catherine [name] is a 62 year old female with an underlying medical history of allergies, arthritis, reflux disease, carpal tunnel syndrome, arthritis, and morbid obesity with a BMI of over 45, who presents for follow-up consultation (obstructive sleep apnea after interim testing and starting home AutoPap therapy.  She had a home sleep test on 07/08/2022 which showed severe obstructive sleep apnea with an AHI of 32.3/h, O2 nadir 71% with time below or at 88% saturation of over 15 minutes.  Snoring was  mild to moderate.   She has been on home AutoPap therapy since 08/01/2022.  She has a ResMed air sense 11 AutoSet machine.  Her DME company is [facility].  In the past month her usage has declined, she was compliant with treatment as far as insurance mandated compliance in the first month of her therapy.  She is encouraged to be consistent with her AutoPap given her severe sleep apnea diagnosis.  She is currently working on weight loss as well.  She is advised to talk to her DME provider about alternative mask options for her.  She is commended for her treatment adherence and her motivation to continue with AutoPap therapy.  At this juncture, she is advised to follow-up routinely in sleep clinic to see one of our nurse practitioners in 1 year, we can offer her a virtual visit as well through MyChart if she likes.  Settings are good, leak from the mask is fluctuating and generally acceptable, apnea scores good on the current pressure settings.  We talked about her home sleep test results and reviewed her compliance data in detail today.  I answered all her questions today and she was in agreement with our plan.  I spent 30 minutes in total face-to-face time and in reviewing records during pre-charting, more than 50% of which was spent in counseling and coordination of care, reviewing test results, reviewing medications and treatment regimen and/or in discussing or reviewing the diagnosis of OSA, the prognosis and treatment options. Pertinent laboratory and imaging test results that were available during this visit with the patient were reviewed by me and considered in my medical decision making (see chart for details).

## 2022-10-08 NOTE — Patient Instructions (Addendum)
It was nice to see you again today. I am glad to hear, things are going fairly well with your autoPAP therapy. You are generally compliant with it, but usage has declined lately. You fulfilled the insurance-mandated compliance percentage in the first month, which is reassuring, so you can get ongoing supplies through your insurance. Please talk to your DME provider about getting replacement supplies on a regular basis. Please be sure to change your filter every month, your mask about every 3 months, hose about every 6 months, humidifier chamber about yearly. Some restrictions are imposed by your insurance carrier with regard to how frequently you can get certain supplies.  Your DME company can provide further details if necessary.  You can talk to [facility] about trying a different mask.    Please continue using your autoPAP regularly. While your insurance requires that you use PAP at least 4 hours each night on 70% of the nights, I recommend, that you not skip any nights and use it throughout the night if you can. Getting used to PAP and staying with the treatment long term does take time and patience and discipline. Untreated obstructive sleep apnea when it is moderate to severe can have an adverse impact on cardiovascular health and raise her risk for heart disease, arrhythmias, hypertension, congestive heart failure, stroke and diabetes. Untreated obstructive sleep apnea causes sleep disruption, nonrestorative sleep, and sleep deprivation. This can have an impact on your day to day functioning and cause daytime sleepiness and impairment of cognitive function, memory loss, mood disturbance, and problems focussing. Using PAP regularly can improve these symptoms.  We can see you in 1 year, you can see one of our nurse practitioners.

## 2022-10-14 ENCOUNTER — Ambulatory Visit (INDEPENDENT_AMBULATORY_CARE_PROVIDER_SITE_OTHER): Payer: 59 | Admitting: Family Medicine

## 2022-10-14 ENCOUNTER — Other Ambulatory Visit (HOSPITAL_COMMUNITY): Payer: Self-pay

## 2022-10-14 ENCOUNTER — Encounter (INDEPENDENT_AMBULATORY_CARE_PROVIDER_SITE_OTHER): Payer: Self-pay | Admitting: Family Medicine

## 2022-10-14 VITALS — BP 131/81 | HR 78 | Temp 97.9°F | Ht 61.0 in | Wt 218.0 lb

## 2022-10-14 DIAGNOSIS — R632 Polyphagia: Secondary | ICD-10-CM

## 2022-10-14 DIAGNOSIS — E669 Obesity, unspecified: Secondary | ICD-10-CM | POA: Diagnosis not present

## 2022-10-14 DIAGNOSIS — Z6841 Body Mass Index (BMI) 40.0 and over, adult: Secondary | ICD-10-CM

## 2022-10-14 MED ORDER — WEGOVY 1 MG/0.5ML ~~LOC~~ SOAJ: SUBCUTANEOUS | 0 refills | Status: DC

## 2022-10-14 NOTE — Progress Notes (Unsigned)
Chief Complaint:   OBESITY Catherine [name] is here to discuss her progress with her obesity treatment plan along with follow-up of her obesity related diagnoses. Catherine [name] is on the Category 2 Plan and keeping a food journal and adhering to recommended goals of 400-550 calories and 35+ grams of protein at supper and states she is following her eating plan approximately 75% of the time. Catherine [name] states she is doing 0 minutes 0 times per week.  Today's visit was #: 4 Starting weight: 226 lbs Starting date: 09/02/2022 Today's weight: 218 lbs Today's date: 10/14/2022 Total lbs lost to date: 8 Total lbs lost since last in-office visit: 3  Interim History: Patient has done well with her weight loss over the last 2 weeks.  Her hunger has improved, but she is getting bored with her breakfast options.  Her protein intake is starting to decrease especially with lunch.  Subjective:   1. Polyphagia Patient has restarted [facility] and she is doing well with decreased polyphagia.  No side effects were noted.  Assessment/Plan:   1. Polyphagia Patient will continue Wegovy at 1 mg once weekly, and we will refill for 1 month.  - WEGOVY 1 MG/0.5ML SOAJ; 1mg  sq qweek  Dispense: 2 mL; Refill: 0  2. BMI 40.0-44.9, adult (HCC)  3. Obesity, Beginning BMI 42.8 Catherine [name] is currently in the action stage of change. As such, her goal is to continue with weight loss efforts. She has agreed to the Category 2 Plan with breakfast options.   Behavioral modification strategies: increasing lean protein intake and no skipping meals.  Catherine [name] has agreed to follow-up with our clinic in 2 to 3 weeks. She was informed of the importance of frequent follow-up visits to maximize her success with intensive lifestyle modifications for her multiple health conditions.   Objective:   Blood pressure 131/81, pulse 78, temperature 97.9 F (36.6 C), height 5\' 1"  (1.549 m), weight 218 lb (98.9 kg), last menstrual period 04/02/2018, SpO2 95 %. Body mass  index is 41.19 kg/m.  General: Cooperative, alert, well developed, in no acute distress. HEENT: Conjunctivae and lids unremarkable. Cardiovascular: Regular rhythm.  Lungs: Normal work of breathing. Neurologic: No focal deficits.   Lab Results  Component Value Date   CREATININE 0.75 09/02/2022   BUN 11 09/02/2022   NA 142 09/02/2022   K 4.4 09/02/2022   CL 105 09/02/2022   CO2 18 (L) 09/02/2022   Lab Results  Component Value Date   ALT 12 09/02/2022   AST 14 09/02/2022   ALKPHOS 87 09/02/2022   BILITOT 0.3 09/02/2022   Lab Results  Component Value Date   HGBA1C 6.1 (H) 09/02/2022   Lab Results  Component Value Date   INSULIN 28.7 (H) 09/02/2022   Lab Results  Component Value Date   TSH 3.080 09/02/2022   Lab Results  Component Value Date   CHOL 136 09/02/2022   HDL 47 09/02/2022   LDLCALC 73 09/02/2022   TRIG 80 09/02/2022   Lab Results  Component Value Date   VD25OH 57.7 09/02/2022   Lab Results  Component Value Date   WBC 9.5 09/23/2022   HGB 13.0 09/23/2022   HCT 41.1 09/23/2022   MCV 83.5 09/23/2022   PLT 269 09/23/2022   No results found for: "IRON", "TIBC", "FERRITIN"  Attestation Statements:   Reviewed by clinician on day of visit: allergies, medications, problem list, medical history, surgical history, family history, social history, and previous encounter notes.  Time spent on visit including  pre-visit chart review and post-visit care and charting was 30 minutes.   I, Burt Knack, am acting as transcriptionist for Quillian Quince, MD.  I have reviewed the above documentation for accuracy and completeness, and I agree with the above. -  Quillian Quince, MD

## 2022-10-15 ENCOUNTER — Ambulatory Visit: Payer: 59 | Admitting: Neurology

## 2022-10-16 ENCOUNTER — Other Ambulatory Visit (HOSPITAL_COMMUNITY): Payer: Self-pay

## 2022-10-16 ENCOUNTER — Encounter (INDEPENDENT_AMBULATORY_CARE_PROVIDER_SITE_OTHER): Payer: Self-pay | Admitting: Family Medicine

## 2022-10-16 DIAGNOSIS — R632 Polyphagia: Secondary | ICD-10-CM

## 2022-10-16 MED ORDER — WEGOVY 1 MG/0.5ML ~~LOC~~ SOAJ
1.0000 mg | SUBCUTANEOUS | 0 refills | Status: DC
Start: 2022-10-16 — End: 2022-10-28
  Filled 2022-10-16: qty 2, 28d supply, fill #0

## 2022-10-22 ENCOUNTER — Encounter (HOSPITAL_BASED_OUTPATIENT_CLINIC_OR_DEPARTMENT_OTHER): Payer: Self-pay | Admitting: Obstetrics & Gynecology

## 2022-10-22 ENCOUNTER — Ambulatory Visit (HOSPITAL_BASED_OUTPATIENT_CLINIC_OR_DEPARTMENT_OTHER): Payer: 59 | Admitting: Obstetrics & Gynecology

## 2022-10-22 VITALS — BP 130/80 | HR 91 | Ht 61.0 in | Wt 218.4 lb

## 2022-10-22 DIAGNOSIS — N950 Postmenopausal bleeding: Secondary | ICD-10-CM | POA: Diagnosis not present

## 2022-10-22 DIAGNOSIS — M65311 Trigger thumb, right thumb: Secondary | ICD-10-CM | POA: Insufficient documentation

## 2022-10-22 DIAGNOSIS — N840 Polyp of corpus uteri: Secondary | ICD-10-CM | POA: Diagnosis not present

## 2022-10-22 NOTE — Progress Notes (Signed)
GYNECOLOGY  VISIT  CC:   post op recheck  HPI: 62 y.o. G4P2022 Married White or Caucasian female here for recheck after undergoing hysteroscopy on 09/23/2022.  She reports bleeding has been pink and some spotting intermittently.  Last week she did need to wear a mini-pad.  Took the last progesterone today.  Pathology reviewed.  Will stop the progesterone for now.  She has no pain.  Bowel function is Normal.  Bladder function is normal.    Pathology reviewed:  Yes .  Questions answered.    MEDS:   Current Outpatient Medications on File Prior to Visit  Medication Sig Dispense Refill   cetirizine (ZYRTEC) 10 MG tablet Take 10 mg by mouth daily.     fluticasone (FLONASE) 50 MCG/ACT nasal spray Place 2 sprays into both nostrils daily.     medroxyPROGESTERone (PROVERA) 10 MG tablet TAKE 1 TABLET BY MOUTH EVERY DAY 90 tablet 3   pantoprazole (PROTONIX) 40 MG tablet Take 40 mg by mouth daily.     WEGOVY 1 MG/0.5ML SOAJ Inject 1 mg into the skin once a week. 2 mL 0   No current facility-administered medications on file prior to visit.    SH:  Smoking No    PHYSICAL EXAMINATION:    BP 130/80 (BP Location: Left Arm, Patient Position: Sitting, Cuff Size: Large)   Pulse 91   Ht 5\' 1"  (1.549 m) Comment: Reported  Wt 218 lb 6.4 oz (99.1 kg)   LMP 04/02/2018   BMI 41.27 kg/m     General appearance: alert, cooperative and appears stated age CV:  Regular rate and rhythm Lungs:  clear to auscultation, no wheezes, rales or rhonchi, symmetric air entry Abdomen: soft, non-tender; bowel sounds normal; no masses,  no organomegaly  Assessment/Plan: 1. Postmenopausal bleeding - she is going to keep a lot and let me know about bleeding over the next three months - AEX will be scheduled in March  2. Endometrial polyp - pathology reviewed

## 2022-10-28 ENCOUNTER — Ambulatory Visit (INDEPENDENT_AMBULATORY_CARE_PROVIDER_SITE_OTHER): Payer: 59 | Admitting: Family Medicine

## 2022-10-28 ENCOUNTER — Other Ambulatory Visit (HOSPITAL_COMMUNITY): Payer: Self-pay

## 2022-10-28 VITALS — BP 112/76 | HR 86 | Temp 98.4°F | Ht 61.0 in | Wt 215.0 lb

## 2022-10-28 DIAGNOSIS — R632 Polyphagia: Secondary | ICD-10-CM

## 2022-10-28 DIAGNOSIS — Z6841 Body Mass Index (BMI) 40.0 and over, adult: Secondary | ICD-10-CM | POA: Diagnosis not present

## 2022-10-28 DIAGNOSIS — E669 Obesity, unspecified: Secondary | ICD-10-CM

## 2022-10-28 MED ORDER — WEGOVY 1 MG/0.5ML ~~LOC~~ SOAJ
1.0000 mg | SUBCUTANEOUS | 0 refills | Status: DC
Start: 2022-10-28 — End: 2022-11-18
  Filled 2022-10-28 – 2022-11-18 (×2): qty 2, 28d supply, fill #0

## 2022-10-28 NOTE — Progress Notes (Unsigned)
     Chief Complaint:   OBESITY Catherine [name] is here to discuss her progress with her obesity treatment plan along with follow-up of her obesity related diagnoses. Catherine [name] is on the Category 2 Plan with breakfast options and states she is following her eating plan approximately 75% of the time. Catherine [name] states she is doing 0 minutes 0 times per week.  Today's visit was #: 5 Starting weight: 226 lbs Starting date: 09/02/2022 Today's weight: 215 lbs Today's date: 10/28/2022 Total lbs lost to date: 11 Total lbs lost since last in-office visit: 3  Interim History: Patient continues to do well with her weight loss even over her birthday weekend.  She is doing well with increasing protein with some of the protein recipe ideas we discussed.  Subjective:   1. Polyphagia Patient is doing well on Wegovy, and she denies nausea or vomiting but she is doing well with decreasing portions.  Assessment/Plan:   1. Polyphagia Patient will continue Wegovy 1 mg once weekly, and we will refill for 1 month.  - WEGOVY 1 MG/0.5ML SOAJ; Inject 1 mg into the skin once a week.  Dispense: 2 mL; Refill: 0  2. BMI 40.0-44.9, adult (HCC)  3. Obesity, Beginning BMI 42.8 Catherine [name] is currently in the action stage of change. As such, her goal is to continue with weight loss efforts. She has agreed to the Category 2 Plan.   Behavioral modification strategies: increasing lean protein intake.  Catherine [name] has agreed to follow-up with our clinic in 3 to 4 weeks. She was informed of the importance of frequent follow-up visits to maximize her success with intensive lifestyle modifications for her multiple health conditions.   Objective:   Blood pressure 112/76, pulse 86, temperature 98.4 F (36.9 C), height 5\' 1"  (1.549 m), weight 215 lb (97.5 kg), last menstrual period 04/02/2018, SpO2 92%. Body mass index is 40.62 kg/m.  Lab Results  Component Value Date   CREATININE 0.75 09/02/2022   BUN 11 09/02/2022   NA 142 09/02/2022   K 4.4  09/02/2022   CL 105 09/02/2022   CO2 18 (L) 09/02/2022   Lab Results  Component Value Date   ALT 12 09/02/2022   AST 14 09/02/2022   ALKPHOS 87 09/02/2022   BILITOT 0.3 09/02/2022   Lab Results  Component Value Date   HGBA1C 6.1 (H) 09/02/2022   Lab Results  Component Value Date   INSULIN 28.7 (H) 09/02/2022   Lab Results  Component Value Date   TSH 3.080 09/02/2022   Lab Results  Component Value Date   CHOL 136 09/02/2022   HDL 47 09/02/2022   LDLCALC 73 09/02/2022   TRIG 80 09/02/2022   Lab Results  Component Value Date   VD25OH 57.7 09/02/2022   Lab Results  Component Value Date   WBC 9.5 09/23/2022   HGB 13.0 09/23/2022   HCT 41.1 09/23/2022   MCV 83.5 09/23/2022   PLT 269 09/23/2022   No results found for: "IRON", "TIBC", "FERRITIN"  Attestation Statements:   Reviewed by clinician on day of visit: allergies, medications, problem list, medical history, surgical history, family history, social history, and previous encounter notes.  Time spent on visit including pre-visit chart review and post-visit care and charting was 30 minutes.   I, Burt Knack, am acting as transcriptionist for Quillian Quince, MD.  I have reviewed the above documentation for accuracy and completeness, and I agree with the above. -  Quillian Quince, MD

## 2022-10-29 ENCOUNTER — Ambulatory Visit (INDEPENDENT_AMBULATORY_CARE_PROVIDER_SITE_OTHER): Payer: 59 | Admitting: Family Medicine

## 2022-11-05 ENCOUNTER — Encounter: Payer: Self-pay | Admitting: Neurology

## 2022-11-18 ENCOUNTER — Ambulatory Visit (INDEPENDENT_AMBULATORY_CARE_PROVIDER_SITE_OTHER): Payer: 59 | Admitting: Family Medicine

## 2022-11-18 ENCOUNTER — Other Ambulatory Visit: Payer: Self-pay

## 2022-11-18 ENCOUNTER — Other Ambulatory Visit (HOSPITAL_COMMUNITY): Payer: Self-pay

## 2022-11-18 ENCOUNTER — Encounter (INDEPENDENT_AMBULATORY_CARE_PROVIDER_SITE_OTHER): Payer: Self-pay | Admitting: Family Medicine

## 2022-11-18 VITALS — BP 128/84 | HR 76 | Temp 98.0°F | Ht 61.0 in | Wt 209.0 lb

## 2022-11-18 DIAGNOSIS — K2100 Gastro-esophageal reflux disease with esophagitis, without bleeding: Secondary | ICD-10-CM | POA: Diagnosis not present

## 2022-11-18 DIAGNOSIS — Z6839 Body mass index (BMI) 39.0-39.9, adult: Secondary | ICD-10-CM | POA: Diagnosis not present

## 2022-11-18 DIAGNOSIS — R7303 Prediabetes: Secondary | ICD-10-CM

## 2022-11-18 DIAGNOSIS — E669 Obesity, unspecified: Secondary | ICD-10-CM

## 2022-11-18 MED ORDER — WEGOVY 1 MG/0.5ML ~~LOC~~ SOAJ
1.0000 mg | SUBCUTANEOUS | 0 refills | Status: DC
Start: 2022-11-18 — End: 2022-12-16

## 2022-11-19 ENCOUNTER — Other Ambulatory Visit (HOSPITAL_COMMUNITY): Payer: Self-pay

## 2022-11-19 ENCOUNTER — Other Ambulatory Visit (INDEPENDENT_AMBULATORY_CARE_PROVIDER_SITE_OTHER): Payer: Self-pay | Admitting: Family Medicine

## 2022-11-19 ENCOUNTER — Encounter (INDEPENDENT_AMBULATORY_CARE_PROVIDER_SITE_OTHER): Payer: Self-pay | Admitting: Family Medicine

## 2022-11-19 MED ORDER — WEGOVY 1.7 MG/0.75ML ~~LOC~~ SOAJ: 1.7000 mg | SUBCUTANEOUS | 0 refills | Status: DC

## 2022-11-19 NOTE — Progress Notes (Signed)
Chief Complaint:   OBESITY Catherine [name] is here to discuss her progress with her obesity treatment plan along with follow-up of her obesity related diagnoses. Catherine [name] is on the Category 2 Plan and states she is following her eating plan approximately 75% of the time. Catherine [name] states she is on the treadmill for 20 minutes 1 time per week.  Today's visit was #: 6 Starting weight: 226 lbs Starting date: 09/02/2022 Today's weight: 209 lbs Today's date: 11/18/2022 Total lbs lost to date: 17 Total lbs lost since last in-office visit: 6  Interim History: Patient has done well with her weight loss.  She is having some struggles with her insurance.  Subjective:   1. Prediabetes Patient's A1c has improved at 5.5, down from 6.0; controlled.  2. Gastroesophageal reflux disease with esophagitis, unspecified whether hemorrhage Patient is on Protonix, and she notes some postnasal drip and increased phlegm.  She is taking her medications intermittently.  Assessment/Plan:   1. Prediabetes Patient will continue to work on her diet, and she will try to stay on Wegovy.  We will follow-up at her next visit in 4 weeks.  2. Gastroesophageal reflux disease with esophagitis, unspecified whether hemorrhage Patient is to take Protonix regularly, and we will follow-up at her next visit in 4 weeks.  3. BMI 39.0-39.9,adult  - WEGOVY 1 MG/0.5ML SOAJ; Inject 1 mg into the skin once a week.  Dispense: 2 mL; Refill: 0  4. Obesity, Beginning BMI 42.8 We will refill Wegovy 1 mg once weekly for 1 month.  Patient is to send a MyChart message if prior authorization needs to be done.  - WEGOVY 1 MG/0.5ML SOAJ; Inject 1 mg into the skin once a week.  Dispense: 2 mL; Refill: 0  Catherine [name] is currently in the action stage of change. As such, her goal is to continue with weight loss efforts. She has agreed to the Category 2 Plan.   Exercise goals: As is.   Behavioral modification strategies: increasing lean protein intake.  Catherine [name]  has agreed to follow-up with our clinic in 4 weeks. She was informed of the importance of frequent follow-up visits to maximize her success with intensive lifestyle modifications for her multiple health conditions.   Objective:   Blood pressure 128/84, pulse 76, temperature 98 F (36.7 C), height 5\' 1"  (1.549 m), weight 209 lb (94.8 kg), last menstrual period 04/02/2018, SpO2 93%. Body mass index is 39.49 kg/m.  Lab Results  Component Value Date   CREATININE 0.75 09/02/2022   BUN 11 09/02/2022   NA 142 09/02/2022   K 4.4 09/02/2022   CL 105 09/02/2022   CO2 18 (L) 09/02/2022   Lab Results  Component Value Date   ALT 12 09/02/2022   AST 14 09/02/2022   ALKPHOS 87 09/02/2022   BILITOT 0.3 09/02/2022   Lab Results  Component Value Date   HGBA1C 6.1 (H) 09/02/2022   Lab Results  Component Value Date   INSULIN 28.7 (H) 09/02/2022   Lab Results  Component Value Date   TSH 3.080 09/02/2022   Lab Results  Component Value Date   CHOL 136 09/02/2022   HDL 47 09/02/2022   LDLCALC 73 09/02/2022   TRIG 80 09/02/2022   Lab Results  Component Value Date   VD25OH 57.7 09/02/2022   Lab Results  Component Value Date   WBC 9.5 09/23/2022   HGB 13.0 09/23/2022   HCT 41.1 09/23/2022   MCV 83.5 09/23/2022   PLT 269 09/23/2022  No results found for: "IRON", "TIBC", "FERRITIN"  Attestation Statements:   Reviewed by clinician on day of visit: allergies, medications, problem list, medical history, surgical history, family history, social history, and previous encounter notes.   I, Burt Knack, am acting as transcriptionist for Quillian Quince, MD.  I have reviewed the above documentation for accuracy and completeness, and I agree with the above. -  Quillian Quince, MD

## 2022-12-16 ENCOUNTER — Ambulatory Visit (INDEPENDENT_AMBULATORY_CARE_PROVIDER_SITE_OTHER): Payer: 59 | Admitting: Family Medicine

## 2022-12-16 ENCOUNTER — Encounter (INDEPENDENT_AMBULATORY_CARE_PROVIDER_SITE_OTHER): Payer: Self-pay | Admitting: Family Medicine

## 2022-12-16 VITALS — BP 119/81 | HR 85 | Temp 98.1°F | Ht 61.0 in | Wt 207.0 lb

## 2022-12-16 DIAGNOSIS — J309 Allergic rhinitis, unspecified: Secondary | ICD-10-CM

## 2022-12-16 DIAGNOSIS — R7303 Prediabetes: Secondary | ICD-10-CM

## 2022-12-16 DIAGNOSIS — E669 Obesity, unspecified: Secondary | ICD-10-CM

## 2022-12-16 DIAGNOSIS — J3089 Other allergic rhinitis: Secondary | ICD-10-CM

## 2022-12-16 DIAGNOSIS — Z6839 Body mass index (BMI) 39.0-39.9, adult: Secondary | ICD-10-CM | POA: Diagnosis not present

## 2022-12-16 DIAGNOSIS — Z6838 Body mass index (BMI) 38.0-38.9, adult: Secondary | ICD-10-CM | POA: Insufficient documentation

## 2022-12-16 MED ORDER — WEGOVY 1.7 MG/0.75ML ~~LOC~~ SOAJ: 1.7000 mg | SUBCUTANEOUS | 0 refills | Status: DC

## 2022-12-16 NOTE — Progress Notes (Signed)
.[facility]  Office: [phone]  /  Fax: [phone]  WEIGHT SUMMARY AND BIOMETRICS  Anthropometric Measurements Height: 5\' 1"  (1.549 m) Weight: 207 lb (93.9 kg) BMI (Calculated): 39.13 Weight at Last Visit: 209 lb Weight Lost Since Last Visit: 2 lb Weight Gained Since Last Visit: 0 Starting Weight: 226 lb Total Weight Loss (lbs): 19 lb (8.618 kg)   Body Composition  Body Fat %: 47.6 % Fat Mass (lbs): 98.6 lbs Muscle Mass (lbs): 103 lbs Total Body Water (lbs): 74.8 lbs Visceral Fat Rating : 15   Other Clinical Data Fasting: No Labs: No Today's Visit #: 7 Starting Date: 09/02/22    Chief Complaint: OBESITY  History of Present Illness   The patient, on Wegovy for obesity, presents to discuss her obesity and prediabetes. She reports a weight loss of 2 pounds in the last month since her last visit, following a category 2 eating plan and engaging in walking exercise about fifty percent of the time for twenty to thirty minutes once a week. She is managing her prediabetes with diet and exercise.  She has completed one round of the increased dose but reports no noticeable difference in her appetite or hunger levels. She still experiences cravings and a desire to snack, particularly in the evenings.  The patient also reports a persistent sensation of something in her throat, which she is unsure if it is due to allergies or reflux. This sensation is typically worse first thing in the morning and at night. She is currently taking Zyrtec and Flonase, but not on a daily basis.  She has not been consistent with her exercise regimen and is considering joining a gym. She has several trips planned in the coming months and expresses concern about maintaining her diet during these trips. She reports a tendency to eat out and indulge in treats during these trips.          PHYSICAL EXAM:  Blood pressure 119/81, pulse 85, temperature 98.1 F (36.7 C), height 5\' 1"  (1.549 m), weight 207 lb  (93.9 kg), last menstrual period 04/02/2018, SpO2 93%. Body mass index is 39.11 kg/m.  DIAGNOSTIC DATA REVIEWED:  BMET    Component Value Date/Time   NA 142 09/02/2022 0844   K 4.4 09/02/2022 0844   CL 105 09/02/2022 0844   CO2 18 (L) 09/02/2022 0844   GLUCOSE 88 09/02/2022 0844   BUN 11 09/02/2022 0844   CREATININE 0.75 09/02/2022 0844   CALCIUM 9.2 09/02/2022 0844   Lab Results  Component Value Date   HGBA1C 6.1 (H) 09/02/2022   Lab Results  Component Value Date   INSULIN 28.7 (H) 09/02/2022   Lab Results  Component Value Date   TSH 3.080 09/02/2022   CBC    Component Value Date/Time   WBC 9.5 09/23/2022 0908   RBC 4.92 09/23/2022 0908   HGB 13.0 09/23/2022 0908   HGB 13.2 09/02/2022 0844   HCT 41.1 09/23/2022 0908   HCT 42.0 09/02/2022 0844   PLT 269 09/23/2022 0908   PLT 297 09/02/2022 0844   MCV 83.5 09/23/2022 0908   MCV 84 09/02/2022 0844   MCH 26.4 09/23/2022 0908   MCHC 31.6 09/23/2022 0908   RDW 15.2 09/23/2022 0908   RDW 15.0 09/02/2022 0844   Iron Studies No results found for: "IRON", "TIBC", "FERRITIN", "IRONPCTSAT" Lipid Panel     Component Value Date/Time   CHOL 136 09/02/2022 0844   TRIG 80 09/02/2022 0844   HDL 47 09/02/2022 0844   LDLCALC  73 09/02/2022 0844   Hepatic Function Panel     Component Value Date/Time   PROT 6.9 09/02/2022 0844   ALBUMIN 4.3 09/02/2022 0844   AST 14 09/02/2022 0844   ALT 12 09/02/2022 0844   ALKPHOS 87 09/02/2022 0844   BILITOT 0.3 09/02/2022 0844      Component Value Date/Time   TSH 3.080 09/02/2022 0844   Nutritional Lab Results  Component Value Date   VD25OH 57.7 09/02/2022     Assessment and Plan    Obesity Modest weight loss (2 pounds in the last month) since increasing Wegovy to 1.7mg . Patient reports no significant change in appetite or satiety. Discussed the option of switching to Zepbound which may be more potent for reducing cravings, but patient prefers to continue [facility] for  another month. -Continue Wegovy 1.7mg . -Reevaluate in 1 month.  Prediabetes Managed with diet and exercise. No new concerns. -Continue current management and will continue to follow closely.  Allergic Rhinitis Persistent throat discomfort, possibly due to allergies or reflux. Patient is not taking Zyrtec and Flonase regularly. -Advise regular use of Zyrtec and Flonase, especially during high ragweed season. -Reevaluate symptoms after consistent use of medications.  Follow-up appointment scheduled for January 06, 2023.        She was informed of the importance of frequent follow up visits to maximize her success with intensive lifestyle modifications for her multiple health conditions.    Quillian Quince, MD

## 2023-01-06 ENCOUNTER — Ambulatory Visit (INDEPENDENT_AMBULATORY_CARE_PROVIDER_SITE_OTHER): Payer: 59 | Admitting: Family Medicine

## 2023-01-06 ENCOUNTER — Telehealth: Payer: Self-pay

## 2023-01-06 ENCOUNTER — Encounter (INDEPENDENT_AMBULATORY_CARE_PROVIDER_SITE_OTHER): Payer: Self-pay | Admitting: Family Medicine

## 2023-01-06 VITALS — BP 118/80 | HR 82 | Temp 98.2°F | Ht 61.0 in | Wt 205.0 lb

## 2023-01-06 DIAGNOSIS — Z6838 Body mass index (BMI) 38.0-38.9, adult: Secondary | ICD-10-CM | POA: Diagnosis not present

## 2023-01-06 DIAGNOSIS — R632 Polyphagia: Secondary | ICD-10-CM | POA: Diagnosis not present

## 2023-01-06 DIAGNOSIS — E669 Obesity, unspecified: Secondary | ICD-10-CM | POA: Diagnosis not present

## 2023-01-06 MED ORDER — WEGOVY 1.7 MG/0.75ML ~~LOC~~ SOAJ
1.7000 mg | SUBCUTANEOUS | 0 refills | Status: DC
Start: 2023-01-06 — End: 2023-01-28

## 2023-01-06 NOTE — Progress Notes (Signed)
Chief Complaint:   OBESITY Catherine [name] is here to discuss her progress with her obesity treatment plan along with follow-up of her obesity related diagnoses. Catherine [name] is on the Category 2 Plan and states she is following her eating plan approximately 50% of the time. Catherine [name] states she is walking for 20 minutes 1 time per week.  Today's visit was #: 8 Starting weight: 226 lbs Starting date: 09/02/2022 Today's weight: 205 lbs Today's date: 01/06/2023 Total lbs lost to date: 21 Total lbs lost since last in-office visit: 2  Interim History: Patient did very well with avoiding weight gain while on vacation.  She did well with meeting her protein goals.  Subjective:   1. Polyphagia Patient is on Wegovy, and she is working on her diet and doing well with meal planning and being mindful.  No GI upset was noted.  Assessment/Plan:   1. Polyphagia Patient will continue Wegovy 1.7 mg once weekly, and we will refill for 1 month.  - WEGOVY 1 MG/0.5ML SOAJ; Inject 1.7 mg into the skin once a week. Dispense: 3 mL; Refill: 0   2. BMI 38.0-38.9,adult  3. Obesity, Beginning BMI 42.8 Catherine [name] is currently in the action stage of change. As such, her goal is to continue with weight loss efforts. She has agreed to the Category 2 Plan.   Slow cooker recipes were given to help with meal planning.   Exercise goals: continue to work on increasing her walking frequency with the upcoming cooler weather.   Behavioral modification strategies: increasing lean protein intake and meal planning and cooking strategies.  Catherine [name] has agreed to follow-up with our clinic in 4 weeks. She was informed of the importance of frequent follow-up visits to maximize her success with intensive lifestyle modifications for her multiple health conditions.   Objective:   Blood pressure 118/80, pulse 82, temperature (!) 82 F (27.8 C), height 5\' 1"  (1.549 m), weight 205 lb (93 kg), last menstrual period 04/02/2018, SpO2 93%. Body mass index  is 38.73 kg/m.  Lab Results  Component Value Date   CREATININE 0.75 09/02/2022   BUN 11 09/02/2022   NA 142 09/02/2022   K 4.4 09/02/2022   CL 105 09/02/2022   CO2 18 (L) 09/02/2022   Lab Results  Component Value Date   ALT 12 09/02/2022   AST 14 09/02/2022   ALKPHOS 87 09/02/2022   BILITOT 0.3 09/02/2022   Lab Results  Component Value Date   HGBA1C 6.1 (H) 09/02/2022   Lab Results  Component Value Date   INSULIN 28.7 (H) 09/02/2022   Lab Results  Component Value Date   TSH 3.080 09/02/2022   Lab Results  Component Value Date   CHOL 136 09/02/2022   HDL 47 09/02/2022   LDLCALC 73 09/02/2022   TRIG 80 09/02/2022   Lab Results  Component Value Date   VD25OH 57.7 09/02/2022   Lab Results  Component Value Date   WBC 9.5 09/23/2022   HGB 13.0 09/23/2022   HCT 41.1 09/23/2022   MCV 83.5 09/23/2022   PLT 269 09/23/2022   No results found for: "IRON", "TIBC", "FERRITIN"  Attestation Statements:   Reviewed by clinician on day of visit: allergies, medications, problem list, medical history, surgical history, family history, social history, and previous encounter notes.  Time spent on visit including pre-visit chart review and post-visit care and charting was 30 minutes.   Trude Mcburney, am acting as transcriptionist for Quillian Quince, MD.  I have reviewed the  above documentation for accuracy and completeness, and I agree with the above. -  Quillian Quince, MD

## 2023-01-06 NOTE — Telephone Encounter (Signed)
Started PA for [facility] 1.7mg  via covermymeds

## 2023-01-07 NOTE — Telephone Encounter (Signed)
Wegovy approved from 01/06/23-01/06/24. Per insurance

## 2023-01-28 ENCOUNTER — Encounter (INDEPENDENT_AMBULATORY_CARE_PROVIDER_SITE_OTHER): Payer: Self-pay | Admitting: Family Medicine

## 2023-01-28 ENCOUNTER — Ambulatory Visit (INDEPENDENT_AMBULATORY_CARE_PROVIDER_SITE_OTHER): Payer: 59 | Admitting: Family Medicine

## 2023-01-28 VITALS — BP 120/80 | HR 79 | Temp 98.3°F | Ht 61.0 in | Wt 203.0 lb

## 2023-01-28 DIAGNOSIS — R632 Polyphagia: Secondary | ICD-10-CM

## 2023-01-28 DIAGNOSIS — Z6838 Body mass index (BMI) 38.0-38.9, adult: Secondary | ICD-10-CM | POA: Diagnosis not present

## 2023-01-28 DIAGNOSIS — R7303 Prediabetes: Secondary | ICD-10-CM

## 2023-01-28 DIAGNOSIS — M79646 Pain in unspecified finger(s): Secondary | ICD-10-CM

## 2023-01-28 DIAGNOSIS — E669 Obesity, unspecified: Secondary | ICD-10-CM

## 2023-01-28 DIAGNOSIS — E66813 Obesity, class 3: Secondary | ICD-10-CM

## 2023-01-28 MED ORDER — WEGOVY 1.7 MG/0.75ML ~~LOC~~ SOAJ
1.7000 mg | SUBCUTANEOUS | 0 refills | Status: DC
Start: 2023-01-28 — End: 2023-02-23

## 2023-01-28 NOTE — Progress Notes (Signed)
.smr  Office: [phone]  /  Fax: [phone]  WEIGHT SUMMARY AND BIOMETRICS  Anthropometric Measurements Height: 5\' 1"  (1.549 m) Weight: 203 lb (92.1 kg) BMI (Calculated): 38.38 Weight at Last Visit: 205 lb Weight Lost Since Last Visit: 2 lb Weight Gained Since Last Visit: 0 Starting Weight: 226 lb Total Weight Loss (lbs): 23 lb (10.4 kg) Peak Weight: 241 lb   Body Composition  Body Fat %: 46.2 % Fat Mass (lbs): 94.2 lbs Muscle Mass (lbs): 104 lbs Total Body Water (lbs): 75 lbs Visceral Fat Rating : 15   Other Clinical Data Fasting: yes Labs: no Today's Visit #: 9 Starting Date: 09/02/18    Chief Complaint: OBESITY   Discussed the use of AI scribe software for clinical note transcription with the patient, who gave verbal consent to proceed.  History of Present Illness   The patient, diagnosed with prediabetes and obesity, has been on Wegovy 1.7 for blood sugar control. She reports a weight loss of two pounds over the last three weeks. She has been adhering to the category two eating plan approximately 50% of the time and has incorporated walking for exercise, albeit only once a week for twenty minutes.  The patient acknowledges some difficulty in maintaining a high protein diet, particularly in social situations where she tends to overeat. However, she expresses mindfulness about her food choices and meal planning. She has not experienced any gastrointestinal issues or low blood sugar symptoms with the [facility].  In addition to her prediabetes and obesity, the patient has been experiencing issues with her thumb, which has required two cortisone shots from a hand specialist. Despite temporary relief, the discomfort returns, leading to consideration of a surgical intervention.  The patient's goal is to be under 200 pounds by the end of the year.          PHYSICAL EXAM:  Blood pressure 120/80, pulse 79, temperature 98.3 F (36.8 C), height 5\' 1"  (1.549 m), weight  203 lb (92.1 kg), last menstrual period 04/02/2018, SpO2 94%. Body mass index is 38.36 kg/m.  DIAGNOSTIC DATA REVIEWED:  BMET    Component Value Date/Time   NA 142 09/02/2022 0844   K 4.4 09/02/2022 0844   CL 105 09/02/2022 0844   CO2 18 (L) 09/02/2022 0844   GLUCOSE 88 09/02/2022 0844   BUN 11 09/02/2022 0844   CREATININE 0.75 09/02/2022 0844   CALCIUM 9.2 09/02/2022 0844   Lab Results  Component Value Date   HGBA1C 6.1 (H) 09/02/2022   Lab Results  Component Value Date   INSULIN 28.7 (H) 09/02/2022   Lab Results  Component Value Date   TSH 3.080 09/02/2022   CBC    Component Value Date/Time   WBC 9.5 09/23/2022 0908   RBC 4.92 09/23/2022 0908   HGB 13.0 09/23/2022 0908   HGB 13.2 09/02/2022 0844   HCT 41.1 09/23/2022 0908   HCT 42.0 09/02/2022 0844   PLT 269 09/23/2022 0908   PLT 297 09/02/2022 0844   MCV 83.5 09/23/2022 0908   MCV 84 09/02/2022 0844   MCH 26.4 09/23/2022 0908   MCHC 31.6 09/23/2022 0908   RDW 15.2 09/23/2022 0908   RDW 15.0 09/02/2022 0844   Iron Studies No results found for: "IRON", "TIBC", "FERRITIN", "IRONPCTSAT" Lipid Panel     Component Value Date/Time   CHOL 136 09/02/2022 0844   TRIG 80 09/02/2022 0844   HDL 47 09/02/2022 0844   LDLCALC 73 09/02/2022 0844   Hepatic Function Panel  Component Value Date/Time   PROT 6.9 09/02/2022 0844   ALBUMIN 4.3 09/02/2022 0844   AST 14 09/02/2022 0844   ALT 12 09/02/2022 0844   ALKPHOS 87 09/02/2022 0844   BILITOT 0.3 09/02/2022 0844      Component Value Date/Time   TSH 3.080 09/02/2022 0844   Nutritional Lab Results  Component Value Date   VD25OH 57.7 09/02/2022     Assessment and Plan    PreDiabetes Mellitus and Obesity Patient is on [facility] 1.7mg  for blood sugar control and has lost 2 pounds in the last 3 weeks. She is following the category 2 eating plan 50% of the time and walking for exercise 20 minutes once a week. No GI issues or hypoglycemic episodes reported  with [facility] 1.7mg  for blood sugar control. -Encourage adherence to category 2 eating plan and increase walking frequency to at least twice a week. -Refill [id number] prescription and send to [facility] [address]. -Plan to check fasting labs at next visit.  Thumb Pain Patient has seen a hand specialist for ongoing thumb pain. Cortisone shots have provided temporary relief but pain returns. Specialist has suggested a minor surgical procedure. -Encourage patient to consider the surgical procedure if thumb pain continues to affect daily activities.  General Health Maintenance -Next appointment scheduled for 02/23/2023. -Schedule December appointment. -Goal to be under 200 pounds by the end of the year.        She was informed of the importance of frequent follow up visits to maximize her success with intensive lifestyle modifications for her multiple health conditions.    Quillian Quince, MD

## 2023-02-23 ENCOUNTER — Encounter (INDEPENDENT_AMBULATORY_CARE_PROVIDER_SITE_OTHER): Payer: Self-pay | Admitting: Family Medicine

## 2023-02-23 ENCOUNTER — Ambulatory Visit (INDEPENDENT_AMBULATORY_CARE_PROVIDER_SITE_OTHER): Payer: 59 | Admitting: Family Medicine

## 2023-02-23 VITALS — BP 132/83 | HR 85 | Temp 97.9°F | Ht 61.0 in | Wt 205.0 lb

## 2023-02-23 DIAGNOSIS — R7303 Prediabetes: Secondary | ICD-10-CM

## 2023-02-23 DIAGNOSIS — E669 Obesity, unspecified: Secondary | ICD-10-CM

## 2023-02-23 DIAGNOSIS — Z6838 Body mass index (BMI) 38.0-38.9, adult: Secondary | ICD-10-CM

## 2023-02-23 DIAGNOSIS — R632 Polyphagia: Secondary | ICD-10-CM | POA: Diagnosis not present

## 2023-02-23 DIAGNOSIS — E66813 Obesity, class 3: Secondary | ICD-10-CM | POA: Insufficient documentation

## 2023-02-23 DIAGNOSIS — G5700 Lesion of sciatic nerve, unspecified lower limb: Secondary | ICD-10-CM | POA: Insufficient documentation

## 2023-02-23 MED ORDER — WEGOVY 2.4 MG/0.75ML ~~LOC~~ SOAJ
2.4000 mg | SUBCUTANEOUS | 0 refills | Status: DC
Start: 2023-02-23 — End: 2023-03-24

## 2023-02-23 NOTE — Progress Notes (Signed)
.smr  Office: [phone]  /  Fax: [phone]  WEIGHT SUMMARY AND BIOMETRICS  Anthropometric Measurements Height: 5\' 1"  (1.549 m) Weight: 205 lb (93 kg) BMI (Calculated): 38.75 Weight at Last Visit: 203 lb Weight Lost Since Last Visit: 0 Weight Gained Since Last Visit: 2 lb Starting Weight: 226 lb Total Weight Loss (lbs): 21 lb (9.526 kg) Peak Weight: 241 lb   Body Composition  Body Fat %: 47.8 % Fat Mass (lbs): 98 lbs Muscle Mass (lbs): 101.8 lbs Total Body Water (lbs): 76.4 lbs Visceral Fat Rating : 15   Other Clinical Data Fasting: yes Labs: no Today's Visit #: 10 Starting Date: 09/02/18    Chief Complaint: OBESITY   History of Present Illness   The patient, with a history of obesity, polyphasia, and prediabetes, presents for a routine follow-up. She reports a weight gain of two pounds over the past three weeks, attributing it to a recent vacation. She admits to struggling with adherence to her category two diet plan, managing to follow it only about 25% of the time. She has been trying to increase her physical activity, currently exercising for about 20 minutes once a week.  The patient also reports issues with her right thumb, described as a painful inability to bend it, with a palpable knot. She has been diagnosed with trigger finger and has a surgical procedure scheduled for the upcoming month to address this issue.  Regarding her polyphasia, the patient is currently on Wegovy 1.7 mg and requests a refill. She feels the medication is helping, but not as much as when she first started it. She is open to increasing the dose to 2.4 mg to better manage her symptoms, especially with the upcoming holiday season.     She notes some R lateral thigh numbness with increased walking recently. No other symptoms or muscle weakness noted      PHYSICAL EXAM:  Blood pressure 132/83, pulse 85, temperature 97.9 F (36.6 C), height 5\' 1"  (1.549 m), weight 205 lb (93 kg),  last menstrual period 04/02/2018, SpO2 95%. Body mass index is 38.73 kg/m.  DIAGNOSTIC DATA REVIEWED:  BMET    Component Value Date/Time   NA 142 09/02/2022 0844   K 4.4 09/02/2022 0844   CL 105 09/02/2022 0844   CO2 18 (L) 09/02/2022 0844   GLUCOSE 88 09/02/2022 0844   BUN 11 09/02/2022 0844   CREATININE 0.75 09/02/2022 0844   CALCIUM 9.2 09/02/2022 0844   Lab Results  Component Value Date   HGBA1C 6.1 (H) 09/02/2022   Lab Results  Component Value Date   INSULIN 28.7 (H) 09/02/2022   Lab Results  Component Value Date   TSH 3.080 09/02/2022   CBC    Component Value Date/Time   WBC 9.5 09/23/2022 0908   RBC 4.92 09/23/2022 0908   HGB 13.0 09/23/2022 0908   HGB 13.2 09/02/2022 0844   HCT 41.1 09/23/2022 0908   HCT 42.0 09/02/2022 0844   PLT 269 09/23/2022 0908   PLT 297 09/02/2022 0844   MCV 83.5 09/23/2022 0908   MCV 84 09/02/2022 0844   MCH 26.4 09/23/2022 0908   MCHC 31.6 09/23/2022 0908   RDW 15.2 09/23/2022 0908   RDW 15.0 09/02/2022 0844   Iron Studies No results found for: "IRON", "TIBC", "FERRITIN", "IRONPCTSAT" Lipid Panel     Component Value Date/Time   CHOL 136 09/02/2022 0844   TRIG 80 09/02/2022 0844   HDL 47 09/02/2022 0844   LDLCALC 73 09/02/2022 0844  Hepatic Function Panel     Component Value Date/Time   PROT 6.9 09/02/2022 0844   ALBUMIN 4.3 09/02/2022 0844   AST 14 09/02/2022 0844   ALT 12 09/02/2022 0844   ALKPHOS 87 09/02/2022 0844   BILITOT 0.3 09/02/2022 0844      Component Value Date/Time   TSH 3.080 09/02/2022 0844   Nutritional Lab Results  Component Value Date   VD25OH 57.7 09/02/2022     Assessment and Plan    Polyphagia Polyphagia managed with Wegovy 1.7 mg. Reports decreased efficacy and a 2-pound weight gain over three weeks. Limited adherence to dietary and exercise plans. Discussed increasing Wegovy to 2.4 mg, especially considering upcoming holidays. Agreed to try the higher dose with the option to  revert if issues arise. - Increase Wegovy to 2.4 mg - Send MyChart message if issues with new dosage - Adhere to dietary recommendations and increase physical activity  Obesity Obesity with recent 2-pound weight gain. Prediabetes, working on diet and weight loss to prevent type 2 diabetes. Discussed holiday eating strategies, including portion control and 'don't let it touch' strategy, which can reduce calorie intake by 40% and increase satisfaction. Encouraged to slow down eating and reassess hunger after each plate. - Continue current dietary and exercise plan - Implement 'don't let it touch' strategy during holiday meals - Increase physical activity - Monitor weight and dietary adherence  Piriformis syndrome -pt shown how to do piriformis stretch if symptoms return as she increases her exercise. Will continue to follow.  General Health Maintenance Blood pressure well-controlled. No issues with current medications. - Continue current medications - Schedule next appointment in January  Follow-up - Ensure next appointment is scheduled for January.       She was informed of the importance of frequent follow up visits to maximize her success with intensive lifestyle modifications for her multiple health conditions.    Quillian Quince, MD

## 2023-03-23 ENCOUNTER — Telehealth (INDEPENDENT_AMBULATORY_CARE_PROVIDER_SITE_OTHER): Payer: Self-pay | Admitting: Family Medicine

## 2023-03-23 NOTE — Telephone Encounter (Signed)
12/16 Pt would like a refill of Wegovy 2.4. Please follow up.

## 2023-03-24 ENCOUNTER — Other Ambulatory Visit (INDEPENDENT_AMBULATORY_CARE_PROVIDER_SITE_OTHER): Payer: Self-pay

## 2023-03-24 ENCOUNTER — Ambulatory Visit (INDEPENDENT_AMBULATORY_CARE_PROVIDER_SITE_OTHER): Payer: 59 | Admitting: Family Medicine

## 2023-03-24 ENCOUNTER — Encounter (INDEPENDENT_AMBULATORY_CARE_PROVIDER_SITE_OTHER): Payer: Self-pay

## 2023-03-24 DIAGNOSIS — R632 Polyphagia: Secondary | ICD-10-CM

## 2023-03-24 MED ORDER — WEGOVY 2.4 MG/0.75ML ~~LOC~~ SOAJ: 2.4000 mg | SUBCUTANEOUS | 0 refills | Status: DC

## 2023-03-24 NOTE — Telephone Encounter (Signed)
[place] to rf x 1

## 2023-04-29 ENCOUNTER — Encounter (INDEPENDENT_AMBULATORY_CARE_PROVIDER_SITE_OTHER): Payer: Self-pay | Admitting: Family Medicine

## 2023-04-29 ENCOUNTER — Ambulatory Visit (INDEPENDENT_AMBULATORY_CARE_PROVIDER_SITE_OTHER): Payer: 59 | Admitting: Family Medicine

## 2023-04-29 VITALS — BP 123/80 | HR 83 | Temp 98.1°F | Ht 61.0 in | Wt 203.0 lb

## 2023-04-29 DIAGNOSIS — R632 Polyphagia: Secondary | ICD-10-CM

## 2023-04-29 DIAGNOSIS — Z6838 Body mass index (BMI) 38.0-38.9, adult: Secondary | ICD-10-CM | POA: Diagnosis not present

## 2023-04-29 DIAGNOSIS — K2100 Gastro-esophageal reflux disease with esophagitis, without bleeding: Secondary | ICD-10-CM | POA: Diagnosis not present

## 2023-04-29 DIAGNOSIS — E669 Obesity, unspecified: Secondary | ICD-10-CM | POA: Diagnosis not present

## 2023-04-29 MED ORDER — WEGOVY 2.4 MG/0.75ML ~~LOC~~ SOAJ
2.4000 mg | SUBCUTANEOUS | 0 refills | Status: DC
Start: 2023-04-29 — End: 2023-06-01

## 2023-04-29 NOTE — Progress Notes (Signed)
.smr  Office: [phone]  /  Fax: [phone]  WEIGHT SUMMARY AND BIOMETRICS  Anthropometric Measurements Height: 5\' 1"  (1.549 m) Weight: 203 lb (92.1 kg) BMI (Calculated): 38.38 Weight at Last Visit: 205 lb Weight Lost Since Last Visit: 2 lb Total Weight Loss (lbs): 23 lb (10.4 kg)   Body Composition  Body Fat %: 48 % Fat Mass (lbs): 97.6 lbs Muscle Mass (lbs): 100.4 lbs Total Body Water (lbs): 76 lbs Visceral Fat Rating : 15   No data recorded  Chief Complaint: OBESITY    History of Present Illness   The patient, with a history of obesity and GERD, presents for a follow-up visit. She has been on Wegovy for obesity and Protonix for GERD. Over the past two months, including the holiday season, she has lost two pounds. She reports adherence to her category two eating plan about 25% of the time and is not currently exercising.  The patient has been experiencing increased heartburn, which she describes as a 'gurgling' sensation and a feeling of needing to burp. This has been ongoing for several weeks but has improved slightly in the past week. She has been taking Protonix regularly for this issue.  The patient also reports a strong craving for sweets, which she identifies as a challenge in adhering to her eating plan. She expresses difficulty in meeting her protein intake requirements. She has a preference for grilled seafood and poultry, and is open to trying a new pescetarian eating plan.  In addition to her obesity and GERD, the patient recently underwent a procedure to release a cyst in her thumb. The procedure was successful, with no complications, and has resulted in improved mobility in the thumb.          PHYSICAL EXAM:  Blood pressure 123/80, pulse 83, temperature 98.1 F (36.7 C), height 5\' 1"  (1.549 m), weight 203 lb (92.1 kg), last menstrual period 04/02/2018, SpO2 95%. Body mass index is 38.36 kg/m.  DIAGNOSTIC DATA REVIEWED:  BMET    Component Value  Date/Time   NA 142 09/02/2022 0844   K 4.4 09/02/2022 0844   CL 105 09/02/2022 0844   CO2 18 (L) 09/02/2022 0844   GLUCOSE 88 09/02/2022 0844   BUN 11 09/02/2022 0844   CREATININE 0.75 09/02/2022 0844   CALCIUM 9.2 09/02/2022 0844   Lab Results  Component Value Date   HGBA1C 6.1 (H) 09/02/2022   Lab Results  Component Value Date   INSULIN 28.7 (H) 09/02/2022   Lab Results  Component Value Date   TSH 3.080 09/02/2022   CBC    Component Value Date/Time   WBC 9.5 09/23/2022 0908   RBC 4.92 09/23/2022 0908   HGB 13.0 09/23/2022 0908   HGB 13.2 09/02/2022 0844   HCT 41.1 09/23/2022 0908   HCT 42.0 09/02/2022 0844   PLT 269 09/23/2022 0908   PLT 297 09/02/2022 0844   MCV 83.5 09/23/2022 0908   MCV 84 09/02/2022 0844   MCH 26.4 09/23/2022 0908   MCHC 31.6 09/23/2022 0908   RDW 15.2 09/23/2022 0908   RDW 15.0 09/02/2022 0844   Iron Studies No results found for: "IRON", "TIBC", "FERRITIN", "IRONPCTSAT" Lipid Panel     Component Value Date/Time   CHOL 136 09/02/2022 0844   TRIG 80 09/02/2022 0844   HDL 47 09/02/2022 0844   LDLCALC 73 09/02/2022 0844   Hepatic Function Panel     Component Value Date/Time   PROT 6.9 09/02/2022 0844   ALBUMIN 4.3 09/02/2022 0844  AST 14 09/02/2022 0844   ALT 12 09/02/2022 0844   ALKPHOS 87 09/02/2022 0844   BILITOT 0.3 09/02/2022 0844      Component Value Date/Time   TSH 3.080 09/02/2022 0844   Nutritional Lab Results  Component Value Date   VD25OH 57.7 09/02/2022     Assessment and Plan    Obesity and Polyphagia Patient has been on Wegovy for obesity management and has lost two pounds over the last two months, including during the holiday season. She has been following her category two plan about 25% of the time and has not been exercising. Discussed the option of switching to Zepbound, a GLP-1 with an additional GIP component, potentially increasing effectiveness from 90% to 95%. Patient prefers to continue with Wegovy  due to concerns about insurance and potential hassle of switching medications. - Refill [id number] prescription - Provide pescetarian diet plan - Provide category two diet plan  Gastroesophageal Reflux Disease (GERD) Patient reports intermittent heartburn, exacerbated by HYQMVH. She has been taking Protonix regularly, which has helped manage symptoms. Discussed potential for increased reflux due to GI tract slowing with Wegovy. Advised occasional use of Tums for flare-ups, with caution to avoid frequent use to prevent interference with Protonix absorption. - Continue Protonix as prescribed - Advise occasional use of Tums for flare-ups, with caution to avoid frequent use to prevent interference with Protonix absorption  Follow-up - Schedule follow-up appointments for February and March.        She was informed of the importance of frequent follow up visits to maximize her success with intensive lifestyle modifications for her multiple health conditions.    Quillian Quince, MD

## 2023-05-27 ENCOUNTER — Encounter (INDEPENDENT_AMBULATORY_CARE_PROVIDER_SITE_OTHER): Payer: Self-pay

## 2023-05-27 ENCOUNTER — Ambulatory Visit (INDEPENDENT_AMBULATORY_CARE_PROVIDER_SITE_OTHER): Payer: 59 | Admitting: Family Medicine

## 2023-06-01 ENCOUNTER — Encounter (INDEPENDENT_AMBULATORY_CARE_PROVIDER_SITE_OTHER): Payer: Self-pay | Admitting: Family Medicine

## 2023-06-01 ENCOUNTER — Other Ambulatory Visit (INDEPENDENT_AMBULATORY_CARE_PROVIDER_SITE_OTHER): Payer: Self-pay | Admitting: Family Medicine

## 2023-06-01 DIAGNOSIS — R632 Polyphagia: Secondary | ICD-10-CM

## 2023-06-01 MED ORDER — WEGOVY 2.4 MG/0.75ML ~~LOC~~ SOAJ: 2.4000 mg | SUBCUTANEOUS | 0 refills | Status: DC

## 2023-06-12 LAB — COMPREHENSIVE METABOLIC PANEL
Albumin: 4.1 (ref 3.5–5.0)
Calcium: 8.4 — AB (ref 8.7–10.7)
eGFR: 124.6

## 2023-06-12 LAB — BASIC METABOLIC PANEL
BUN: 13 (ref 4–21)
CO2: 27 — AB (ref 13–22)
Chloride: 108 (ref 99–108)
Creatinine: 0.5 (ref 0.5–1.1)
Glucose: 96
Potassium: 3.7 meq/L (ref 3.5–5.1)
Sodium: 142 (ref 137–147)

## 2023-06-12 LAB — HEPATIC FUNCTION PANEL
ALT: 10 U/L (ref 7–35)
AST: 13 (ref 13–35)
Alkaline Phosphatase: 77 (ref 25–125)
Bilirubin, Total: 0.3

## 2023-06-12 LAB — CBC: RBC: 5.1 (ref 3.87–5.11)

## 2023-06-12 LAB — HEMOGLOBIN A1C: Hemoglobin A1C: 5.5

## 2023-06-12 LAB — CBC AND DIFFERENTIAL
HCT: 41 (ref 36–46)
Hemoglobin: 13.5 (ref 12.0–16.0)
Platelets: 240 10*3/uL (ref 150–400)
WBC: 10.1

## 2023-06-18 LAB — LIPID PANEL
Cholesterol: 135 (ref 0–200)
HDL: 48 (ref 35–70)
LDL Cholesterol: 75
LDl/HDL Ratio: 1.6
Triglycerides: 62 (ref 40–160)

## 2023-06-18 LAB — TSH: TSH: 3.67 (ref 0.41–5.90)

## 2023-06-24 ENCOUNTER — Ambulatory Visit (INDEPENDENT_AMBULATORY_CARE_PROVIDER_SITE_OTHER): Payer: 59 | Admitting: Family Medicine

## 2023-06-24 ENCOUNTER — Encounter (INDEPENDENT_AMBULATORY_CARE_PROVIDER_SITE_OTHER): Payer: Self-pay | Admitting: Family Medicine

## 2023-06-24 VITALS — BP 109/74 | HR 89 | Temp 97.6°F | Ht 61.0 in | Wt 200.0 lb

## 2023-06-24 DIAGNOSIS — J309 Allergic rhinitis, unspecified: Secondary | ICD-10-CM | POA: Diagnosis not present

## 2023-06-24 DIAGNOSIS — E669 Obesity, unspecified: Secondary | ICD-10-CM

## 2023-06-24 DIAGNOSIS — Z6837 Body mass index (BMI) 37.0-37.9, adult: Secondary | ICD-10-CM

## 2023-06-24 DIAGNOSIS — R7303 Prediabetes: Secondary | ICD-10-CM | POA: Diagnosis not present

## 2023-06-24 DIAGNOSIS — R632 Polyphagia: Secondary | ICD-10-CM

## 2023-06-24 DIAGNOSIS — J3089 Other allergic rhinitis: Secondary | ICD-10-CM

## 2023-06-24 MED ORDER — WEGOVY 2.4 MG/0.75ML ~~LOC~~ SOAJ
2.4000 mg | SUBCUTANEOUS | 0 refills | Status: DC
Start: 2023-06-24 — End: 2023-07-28

## 2023-06-24 NOTE — Progress Notes (Signed)
 Office: [phone]  /  Fax: [phone]  WEIGHT SUMMARY AND BIOMETRICS  Anthropometric Measurements Height: 5\' 1"  (1.549 m) Weight: 200 lb (90.7 kg) BMI (Calculated): 37.81 Weight at Last Visit: 203lb Weight Lost Since Last Visit: 3lb Weight Gained Since Last Visit: 0 Starting Weight: 226 lb Total Weight Loss (lbs): 26 lb (11.8 kg)   Body Composition  Body Fat %: 46.6 % Fat Mass (lbs): 93.4 lbs Muscle Mass (lbs): 101.6 lbs Total Body Water (lbs): 73.2 lbs Visceral Fat Rating : 14   Other Clinical Data Fasting: no Labs: no Today's Visit #: 12 Starting Date: 09/02/22    Chief Complaint: OBESITY   Discussed the use of AI scribe software for clinical note transcription with the patient, who gave verbal consent to proceed.  History of Present Illness   Catherine [name] is a 63 year old female who presents for evaluation of her obesity treatment progress.  She is adhering to the category two plan approximately 25% of the time and has engaged in minimal physical activity, specifically walking for about 20 minutes over the last week. Despite this, she has lost three pounds over the past two months. She is attempting to consume adequate protein, though she does not always succeed.  Her recent A1c is 5.5, indicating well-controlled prediabetes. She inquires about her status as prediabetic. She has a genetic predisposition to diabetes, necessitating ongoing management to prevent progression.  She reports experiencing cold symptoms this week, which have reduced her hunger. Normally, her hunger is manageable, although she still experiences cravings for sweets.  She has allergies and takes Zyrtec approximately three times a week, particularly during spring or fall. She notes that her allergies may have been exacerbated by recent contact with her grandchildren, who were sick over the weekend.  Her current medication includes Wegovy, which she uses for both weight loss and  management of prediabetes. She requires a refill.          PHYSICAL EXAM:  Blood pressure 109/74, pulse 89, temperature 97.6 F (36.4 C), height 5\' 1"  (1.549 m), weight 200 lb (90.7 kg), last menstrual period 04/02/2018, SpO2 93%. Body mass index is 37.79 kg/m.  DIAGNOSTIC DATA REVIEWED:  BMET    Component Value Date/Time   NA 142 09/02/2022 0844   K 4.4 09/02/2022 0844   CL 105 09/02/2022 0844   CO2 18 (L) 09/02/2022 0844   GLUCOSE 88 09/02/2022 0844   BUN 11 09/02/2022 0844   CREATININE 0.75 09/02/2022 0844   CALCIUM 9.2 09/02/2022 0844   Lab Results  Component Value Date   HGBA1C 6.1 (H) 09/02/2022   Lab Results  Component Value Date   INSULIN 28.7 (H) 09/02/2022   Lab Results  Component Value Date   TSH 3.080 09/02/2022   CBC    Component Value Date/Time   WBC 9.5 09/23/2022 0908   RBC 4.92 09/23/2022 0908   HGB 13.0 09/23/2022 0908   HGB 13.2 09/02/2022 0844   HCT 41.1 09/23/2022 0908   HCT 42.0 09/02/2022 0844   PLT 269 09/23/2022 0908   PLT 297 09/02/2022 0844   MCV 83.5 09/23/2022 0908   MCV 84 09/02/2022 0844   MCH 26.4 09/23/2022 0908   MCHC 31.6 09/23/2022 0908   RDW 15.2 09/23/2022 0908   RDW 15.0 09/02/2022 0844   Iron Studies No results found for: "IRON", "TIBC", "FERRITIN", "IRONPCTSAT" Lipid Panel     Component Value Date/Time   CHOL 136 09/02/2022 0844   TRIG 80 09/02/2022 0844  HDL 47 09/02/2022 0844   LDLCALC 73 09/02/2022 0844   Hepatic Function Panel     Component Value Date/Time   PROT 6.9 09/02/2022 0844   ALBUMIN 4.3 09/02/2022 0844   AST 14 09/02/2022 0844   ALT 12 09/02/2022 0844   ALKPHOS 87 09/02/2022 0844   BILITOT 0.3 09/02/2022 0844      Component Value Date/Time   TSH 3.080 09/02/2022 0844   Nutritional Lab Results  Component Value Date   VD25OH 57.7 09/02/2022     Assessment and Plan    Obesity She is following the category two plan about 25% of the time and has walked approximately 20 minutes  over the last week. Despite this, she has lost three pounds in the last two months, all of which was fat. She is making an effort to increase protein intake, which is emphasized as more important than strictly adhering to calorie limits. Continued focus on protein intake is encouraged to mitigate any potential damage from increased calories. She is also using Wegovy, which is beneficial for both weight loss and prediabetes management. - Refill [id number] prescription and [facility] [address].  Prediabetes Her A1c is 5.5, indicating well-controlled prediabetes. While the prediabetes is in remission, it is not cured, and dietary indiscretions could lead to its return. Well-controlled prediabetes has no complications, and maintaining current management strategies is crucial to prevent progression. The genetic predisposition towards diabetes is acknowledged, and the importance of lifestyle management is emphasized.  Allergic Rhinitis She experiences allergies and takes Zyrtec approximately three times a week. Antihistamines may contribute to weight gain, although the effect is minimal. Flonase, a nasal spray that does not have the weight gain effect, is suggested for use during allergy seasons. - Consider using Flonase nasal spray OTC during spring and fall allergy seasons.  General Health Maintenance She is engaging in physical activity and is encouraged to continue walking as the weather improves. A high-protein, low-calorie dessert recipe is shared to support dietary goals. - Encourage continued walking as weather improves. - Provide high-protein, low-calorie dessert recipe.        She was informed of the importance of frequent follow up visits to maximize her success with intensive lifestyle modifications for her multiple health conditions.    Quillian Quince, MD

## 2023-06-30 ENCOUNTER — Other Ambulatory Visit (INDEPENDENT_AMBULATORY_CARE_PROVIDER_SITE_OTHER): Payer: Self-pay | Admitting: *Deleted

## 2023-07-02 ENCOUNTER — Other Ambulatory Visit (INDEPENDENT_AMBULATORY_CARE_PROVIDER_SITE_OTHER): Payer: Self-pay | Admitting: *Deleted

## 2023-07-28 ENCOUNTER — Telehealth (INDEPENDENT_AMBULATORY_CARE_PROVIDER_SITE_OTHER): Payer: Self-pay | Admitting: *Deleted

## 2023-07-28 ENCOUNTER — Encounter (INDEPENDENT_AMBULATORY_CARE_PROVIDER_SITE_OTHER): Payer: Self-pay | Admitting: Family Medicine

## 2023-07-28 ENCOUNTER — Telehealth (INDEPENDENT_AMBULATORY_CARE_PROVIDER_SITE_OTHER): Admitting: Family Medicine

## 2023-07-28 VITALS — Ht 61.0 in | Wt 200.0 lb

## 2023-07-28 DIAGNOSIS — Z6837 Body mass index (BMI) 37.0-37.9, adult: Secondary | ICD-10-CM | POA: Diagnosis not present

## 2023-07-28 DIAGNOSIS — E669 Obesity, unspecified: Secondary | ICD-10-CM | POA: Diagnosis not present

## 2023-07-28 DIAGNOSIS — R632 Polyphagia: Secondary | ICD-10-CM

## 2023-07-28 DIAGNOSIS — Z6838 Body mass index (BMI) 38.0-38.9, adult: Secondary | ICD-10-CM

## 2023-07-28 DIAGNOSIS — J309 Allergic rhinitis, unspecified: Secondary | ICD-10-CM

## 2023-07-28 MED ORDER — ZEPBOUND 12.5 MG/0.5ML ~~LOC~~ SOAJ: 12.5000 mg | SUBCUTANEOUS | 0 refills | Status: DC

## 2023-07-28 NOTE — Telephone Encounter (Signed)
 Gregary Lean Kadrmas (Key: [id number])  form thumbnail [employer] has not yet replied to your PA request. Depending on the information you've provided, additional questions may be returned by the plan. You may close this dialog, return to your dashboard, and perform other tasks.  To check for an update later, open this request again from your dashboard.  If [employer] has not replied to your request within 24 hours please contact [employer] at [phone].

## 2023-07-29 DIAGNOSIS — F32A Depression, unspecified: Secondary | ICD-10-CM | POA: Insufficient documentation

## 2023-07-29 NOTE — Telephone Encounter (Signed)
 Your PA request has been approved. Additional information will be provided in the approval communication. (Message 1145). Authorization Expiration Date: March 28, 2024.

## 2023-07-29 NOTE — Progress Notes (Signed)
 Office: [phone]  /  Fax: [phone]  WEIGHT SUMMARY AND BIOMETRICS  Anthropometric Measurements Height: 5\' 1"  (1.549 m) Weight: 200 lb (90.7 kg) BMI (Calculated): 37.81   No data recorded Other Clinical Data Fasting: No Labs: No Today's Visit #: 13 Starting Date: 09/02/22 Comments: Virtual Visit    Chief Complaint: OBESITY   Virtual Visit via Telephone Note  I connected with Catherine [name] on 07/29/23 at 12:00 PM EDT by audiovisual telehealth and verified that I am speaking with the correct person using two identifiers.  Location: Patient: home Provider: home   I discussed the limitations, risks, security and privacy concerns of performing an evaluation and management service by AV telehealth and the availability of in person appointments. I also discussed with the patient that there may be a patient responsible charge related to this service. The patient expressed understanding and agreed to proceed.    History of Present Illness Catherine [name] is a 63 year old female who presents for treatment follow-up.  She is following a category two eating plan but struggles with cravings, particularly in the evening. She has tried a recommended recipe that satisfied her cravings and plans to have more of these options available to aid in behavior modification.  She has a history of polyphagia and is currently on Wegovy . She has contacted her pharmacy regarding insurance coverage for Zepbound , which is confirmed but requires prior authorization.  She also has a history of allergic rhinitis, which has been problematic this spring but is improving. She is taking Zyrtec regularly and using Flonase infrequently, which she feels is controlling her symptoms well.      PHYSICAL EXAM:  Height 5\' 1"  (1.549 m), weight 200 lb (90.7 kg), last menstrual period 04/02/2018. Body mass index is 37.79 kg/m.  DIAGNOSTIC DATA REVIEWED:  BMET    Component Value Date/Time   NA  142 06/12/2023 0000   K 3.7 06/12/2023 0000   CL 108 06/12/2023 0000   CO2 27 (A) 06/12/2023 0000   GLUCOSE 88 09/02/2022 0844   BUN 13 06/12/2023 0000   CREATININE 0.5 06/12/2023 0000   CREATININE 0.75 09/02/2022 0844   CALCIUM 8.4 (A) 06/12/2023 0000   Lab Results  Component Value Date   HGBA1C 5.5 06/12/2023   HGBA1C 6.1 (H) 09/02/2022   Lab Results  Component Value Date   INSULIN  28.7 (H) 09/02/2022   Lab Results  Component Value Date   TSH 3.67 06/12/2023   CBC    Component Value Date/Time   WBC 10.1 06/12/2023 0000   WBC 9.5 09/23/2022 0908   RBC 5.1 06/12/2023 0000   HGB 13.5 06/12/2023 0000   HGB 13.2 09/02/2022 0844   HCT 41 06/12/2023 0000   HCT 42.0 09/02/2022 0844   PLT 240 06/12/2023 0000   PLT 297 09/02/2022 0844   MCV 83.5 09/23/2022 0908   MCV 84 09/02/2022 0844   MCH 26.4 09/23/2022 0908   MCHC 31.6 09/23/2022 0908   RDW 15.2 09/23/2022 0908   RDW 15.0 09/02/2022 0844   Iron Studies No results found for: "IRON", "TIBC", "FERRITIN", "IRONPCTSAT" Lipid Panel     Component Value Date/Time   CHOL 135 06/12/2023 0000   CHOL 136 09/02/2022 0844   TRIG 62 06/12/2023 0000   HDL 48 06/12/2023 0000   HDL 47 09/02/2022 0844   LDLCALC 75 06/12/2023 0000   LDLCALC 73 09/02/2022 0844   Hepatic Function Panel     Component Value Date/Time   PROT 6.9  09/02/2022 0844   ALBUMIN 4.1 06/12/2023 0000   ALBUMIN 4.3 09/02/2022 0844   AST 13 06/12/2023 0000   ALT 10 06/12/2023 0000   ALKPHOS 77 06/12/2023 0000   BILITOT 0.3 09/02/2022 0844      Component Value Date/Time   TSH 3.67 06/12/2023 0000   TSH 3.080 09/02/2022 0844   Nutritional Lab Results  Component Value Date   VD25OH 57.7 09/02/2022     Assessment and Plan Assessment & Plan Polyphagia She is currently on Wegovy  for polyphagia. Insurance will cover Zepbound , pending prior authorization. Plan to switch to Zepbound  12.5 mg weekly once authorized. If authorization is delayed, Wegovy   will be refilled to maintain treatment. - Initiate prior authorization for Zepbound  12.5 mg weekly. - Continue Wegovy  until Zepbound  is authorized. - Provide Wegovy  refill if prior authorization for Zepbound  is delayed. - [facility] [address].  Obesity She follows a category two eating plan but struggles with evening cravings. She is working on behavior modification with healthier food options and plans to increase her availability. She tried a recommended recipe and found it satisfying. Encouraged to increase physical activity with shorter, more frequent sessions, aiming for 10-20 minutes most days of the week. - Continue category two eating plan. - Increase walking to 10-20 minutes most days of the week.  Allergic rhinitis She reports improvement in symptoms with Zyrtec and infrequent use of Flonase. Considering switching to Flonase as maintenance therapy due to potential mild weight gain side effect of Zyrtec. - Continue Zyrtec as is. - Consider switching to Flonase as maintenance therapy.    She was informed of the importance of frequent follow up visits to maximize her success with intensive lifestyle modifications for her multiple health conditions.    Jasmine Mesi, MD

## 2023-08-24 ENCOUNTER — Encounter (INDEPENDENT_AMBULATORY_CARE_PROVIDER_SITE_OTHER): Payer: Self-pay | Admitting: Family Medicine

## 2023-08-24 ENCOUNTER — Ambulatory Visit (INDEPENDENT_AMBULATORY_CARE_PROVIDER_SITE_OTHER): Admitting: Family Medicine

## 2023-08-24 VITALS — BP 109/77 | HR 86 | Temp 97.8°F | Ht 61.0 in | Wt 199.0 lb

## 2023-08-24 DIAGNOSIS — R42 Dizziness and giddiness: Secondary | ICD-10-CM

## 2023-08-24 DIAGNOSIS — Z6837 Body mass index (BMI) 37.0-37.9, adult: Secondary | ICD-10-CM | POA: Diagnosis not present

## 2023-08-24 DIAGNOSIS — R632 Polyphagia: Secondary | ICD-10-CM

## 2023-08-24 DIAGNOSIS — E669 Obesity, unspecified: Secondary | ICD-10-CM

## 2023-08-24 MED ORDER — ZEPBOUND 12.5 MG/0.5ML ~~LOC~~ SOAJ: 12.5000 mg | SUBCUTANEOUS | 0 refills | Status: DC

## 2023-08-24 NOTE — Progress Notes (Signed)
 Office: [phone]  /  Fax: [phone]  WEIGHT SUMMARY AND BIOMETRICS  Anthropometric Measurements Height: 5\' 1"  (1.549 m) Weight: 199 lb (90.3 kg) BMI (Calculated): 37.62 Weight at Last Visit: 200 lb Weight Lost Since Last Visit: 1 lb Weight Gained Since Last Visit: 0 Starting Weight: 226 lb Total Weight Loss (lbs): 27 lb (12.2 kg) Peak Weight: 241 lb   Body Composition  Body Fat %: 46.4 % Fat Mass (lbs): 92.6 lbs Muscle Mass (lbs): 101.4 lbs Total Body Water (lbs): 74.6 lbs Visceral Fat Rating : 14   Other Clinical Data Fasting: no Labs: no Today's Visit #: 14 Starting Date: 09/02/22    Chief Complaint: OBESITY    History of Present Illness Catherine [name] is a 63 year old female who presents to discuss her obesity treatment plan and assess her progress.  She is adhering to a category two eating plan approximately fifty percent of the time and engages in physical activity by walking for thirty minutes twice a week. She has lost one pound in the last month and has successfully reduced her weight below 200 pounds, which she finds encouraging.  She is currently taking Zepbound  12.5 mg weekly for polyphagia. She experiences occasional stomachaches after eating but notes a significant reduction in her appetite, stating she doesn't care much about eating throughout the day.  She is attempting to incorporate more protein into her meals. She reports occasional lightheadedness, particularly in the mornings or at night when getting up quickly, and acknowledges not drinking enough water.  No headaches.      PHYSICAL EXAM:  Blood pressure 109/77, pulse 86, temperature 97.8 F (36.6 C), height 5\' 1"  (1.549 m), weight 199 lb (90.3 kg), last menstrual period 04/02/2018, SpO2 95%. Body mass index is 37.6 kg/m.  DIAGNOSTIC DATA REVIEWED:  BMET    Component Value Date/Time   NA 142 06/12/2023 0000   K 3.7 06/12/2023 0000   CL 108 06/12/2023 0000   CO2 27 (A)  06/12/2023 0000   GLUCOSE 88 09/02/2022 0844   BUN 13 06/12/2023 0000   CREATININE 0.5 06/12/2023 0000   CREATININE 0.75 09/02/2022 0844   CALCIUM 8.4 (A) 06/12/2023 0000   Lab Results  Component Value Date   HGBA1C 5.5 06/12/2023   HGBA1C 6.1 (H) 09/02/2022   Lab Results  Component Value Date   INSULIN  28.7 (H) 09/02/2022   Lab Results  Component Value Date   TSH 3.67 06/12/2023   CBC    Component Value Date/Time   WBC 10.1 06/12/2023 0000   WBC 9.5 09/23/2022 0908   RBC 5.1 06/12/2023 0000   HGB 13.5 06/12/2023 0000   HGB 13.2 09/02/2022 0844   HCT 41 06/12/2023 0000   HCT 42.0 09/02/2022 0844   PLT 240 06/12/2023 0000   PLT 297 09/02/2022 0844   MCV 83.5 09/23/2022 0908   MCV 84 09/02/2022 0844   MCH 26.4 09/23/2022 0908   MCHC 31.6 09/23/2022 0908   RDW 15.2 09/23/2022 0908   RDW 15.0 09/02/2022 0844   Iron Studies No results found for: "IRON", "TIBC", "FERRITIN", "IRONPCTSAT" Lipid Panel     Component Value Date/Time   CHOL 135 06/12/2023 0000   CHOL 136 09/02/2022 0844   TRIG 62 06/12/2023 0000   HDL 48 06/12/2023 0000   HDL 47 09/02/2022 0844   LDLCALC 75 06/12/2023 0000   LDLCALC 73 09/02/2022 0844   Hepatic Function Panel     Component Value Date/Time   PROT 6.9 09/02/2022  0844   ALBUMIN 4.1 06/12/2023 0000   ALBUMIN 4.3 09/02/2022 0844   AST 13 06/12/2023 0000   ALT 10 06/12/2023 0000   ALKPHOS 77 06/12/2023 0000   BILITOT 0.3 09/02/2022 0844      Component Value Date/Time   TSH 3.67 06/12/2023 0000   TSH 3.080 09/02/2022 0844   Nutritional Lab Results  Component Value Date   VD25OH 57.7 09/02/2022     Assessment and Plan Assessment & Plan Obesity Obesity management with dietary changes and physical activity. She has lost one pound in the last month, now weighing below 200 pounds. Adhering to the category two eating plan 50% of the time and walking 30 minutes twice weekly. Discussed the importance of protein intake to prevent  metabolic slowdown and counteract Zepbound  effects. Encouraged to consume 80-85 grams of protein daily. Addressed potential challenges during vacation, including increased carbohydrate intake and decreased protein consumption. - Continue category two eating plan. - Increase protein intake to 80-85 grams daily. - Continue walking 30 minutes twice weekly. - Monitor dietary intake during vacation, focusing on protein-rich foods.  Holiday eating strategies for Memorial Day and vacation discussed: - Consider keto or lower carbohydrate bread for burgers. - Add egg and cheese to ground beef for protein-rich burgers. - Consider [employer] beef hot dogs for protein. - Try high protein lower fat egg salad recipe without bread.  Polyphagia Polyphagia managed with Zepbound  12.5 mg weekly. Reports significant appetite decrease and occasional stomachaches post-eating. Emphasized maintaining a structured eating plan to ensure adequate protein intake and prevent carbohydrate increase, counteracting Zepbound  benefits. Highlighted the necessity of food for health improvement despite low appetite. - Continue Zepbound  12.5 mg weekly. - Ensure adequate protein intake to prevent metabolic slowdown. - Monitor for further gastrointestinal side effects.  Lightheadedness Intermittent lightheadedness, particularly in the mornings or upon standing quickly, likely due to dehydration during active weight loss. No associated headaches. Advised to monitor water intake as decreased hunger may lead to decreased thirst. - Increase water intake to prevent dehydration. - Monitor for symptom worsening.      She was informed of the importance of frequent follow up visits to maximize her success with intensive lifestyle modifications for her multiple health conditions.    Jasmine Mesi, MD

## 2023-09-17 ENCOUNTER — Telehealth: Payer: Self-pay | Admitting: Adult Health

## 2023-09-17 NOTE — Telephone Encounter (Signed)
 LVM and sent mychart msg informing pt of need to reschedule 10/12/23 appt - NP out

## 2023-09-23 ENCOUNTER — Ambulatory Visit (INDEPENDENT_AMBULATORY_CARE_PROVIDER_SITE_OTHER): Admitting: Family Medicine

## 2023-09-23 ENCOUNTER — Encounter (INDEPENDENT_AMBULATORY_CARE_PROVIDER_SITE_OTHER): Payer: Self-pay | Admitting: Family Medicine

## 2023-09-23 VITALS — BP 118/77 | HR 75 | Temp 98.2°F | Ht 61.0 in | Wt 199.0 lb

## 2023-09-23 DIAGNOSIS — R7303 Prediabetes: Secondary | ICD-10-CM | POA: Diagnosis not present

## 2023-09-23 DIAGNOSIS — E669 Obesity, unspecified: Secondary | ICD-10-CM | POA: Diagnosis not present

## 2023-09-23 DIAGNOSIS — R632 Polyphagia: Secondary | ICD-10-CM | POA: Diagnosis not present

## 2023-09-23 DIAGNOSIS — Z6837 Body mass index (BMI) 37.0-37.9, adult: Secondary | ICD-10-CM

## 2023-09-23 DIAGNOSIS — E66813 Obesity, class 3: Secondary | ICD-10-CM

## 2023-09-23 MED ORDER — ZEPBOUND 12.5 MG/0.5ML ~~LOC~~ SOAJ
12.5000 mg | SUBCUTANEOUS | 0 refills | Status: DC
Start: 2023-09-23 — End: 2023-10-20

## 2023-09-23 NOTE — Progress Notes (Signed)
 Office: [phone]  /  Fax: [phone]  WEIGHT SUMMARY AND BIOMETRICS  Anthropometric Measurements Height: 5' 1 (1.549 m) Weight: 199 lb (90.3 kg) BMI (Calculated): 37.62 Weight at Last Visit: 199 lb Weight Lost Since Last Visit: 0 Weight Gained Since Last Visit: 0 Starting Weight: 226 lb Total Weight Loss (lbs): 27 lb (12.2 kg) Peak Weight: 241 lb   Body Composition  Body Fat %: 48.1 % Fat Mass (lbs): 96.2 lbs Muscle Mass (lbs): 98.4 lbs Total Body Water (lbs): 77.4 lbs Visceral Fat Rating : 15   Other Clinical Data Fasting: no Labs: no Today's Visit #: 15 Starting Date: 09/02/22    Chief Complaint: OBESITY   History of Present Illness Catherine [name] is a 63 year old female who presents for obesity treatment follow-up.  She has been following the category two eating plan approximately 25% of the time and has maintained her weight over the past month since her last visit.  For exercise, she walks for 30 minutes twice a week and plans to increase this to three times a week, aiming for 15 minutes on her walk pad or 30 minutes outdoors, depending on the weather.  She has been using Zepbound  for appetite control, which she feels is more effective than her previous medication, Wegovy . Sometimes it suppresses her appetite to the point where she does not feel like eating, and on the day of the visit, she had only consumed a cup of coffee.  During a recent vacation at the beach, she engaged in some walking but primarily rested and relaxed. She consumed seafood on three occasions and participated in eating the catch from a fishing trip.  She has been exploring meal planning and preparation strategies, including using ChatGPT to generate recipes and grocery lists, which she finds helpful for maintaining her dietary goals.      PHYSICAL EXAM:  Blood pressure 118/77, pulse 75, temperature 98.2 F (36.8 C), height 5' 1 (1.549 m), weight 199 lb (90.3 kg), last  menstrual period 04/02/2018, SpO2 96%. Body mass index is 37.6 kg/m.  DIAGNOSTIC DATA REVIEWED:  BMET    Component Value Date/Time   NA 142 06/12/2023 0000   K 3.7 06/12/2023 0000   CL 108 06/12/2023 0000   CO2 27 (A) 06/12/2023 0000   GLUCOSE 88 09/02/2022 0844   BUN 13 06/12/2023 0000   CREATININE 0.5 06/12/2023 0000   CREATININE 0.75 09/02/2022 0844   CALCIUM 8.4 (A) 06/12/2023 0000   Lab Results  Component Value Date   HGBA1C 5.5 06/12/2023   HGBA1C 6.1 (H) 09/02/2022   Lab Results  Component Value Date   INSULIN  28.7 (H) 09/02/2022   Lab Results  Component Value Date   TSH 3.67 06/12/2023   CBC    Component Value Date/Time   WBC 10.1 06/12/2023 0000   WBC 9.5 09/23/2022 0908   RBC 5.1 06/12/2023 0000   HGB 13.5 06/12/2023 0000   HGB 13.2 09/02/2022 0844   HCT 41 06/12/2023 0000   HCT 42.0 09/02/2022 0844   PLT 240 06/12/2023 0000   PLT 297 09/02/2022 0844   MCV 83.5 09/23/2022 0908   MCV 84 09/02/2022 0844   MCH 26.4 09/23/2022 0908   MCHC 31.6 09/23/2022 0908   RDW 15.2 09/23/2022 0908   RDW 15.0 09/02/2022 0844   Iron Studies No results found for: IRON, TIBC, FERRITIN, IRONPCTSAT Lipid Panel     Component Value Date/Time   CHOL 135 06/12/2023 0000   CHOL 136 09/02/2022  0844   TRIG 62 06/12/2023 0000   HDL 48 06/12/2023 0000   HDL 47 09/02/2022 0844   LDLCALC 75 06/12/2023 0000   LDLCALC 73 09/02/2022 0844   Hepatic Function Panel     Component Value Date/Time   PROT 6.9 09/02/2022 0844   ALBUMIN 4.1 06/12/2023 0000   ALBUMIN 4.3 09/02/2022 0844   AST 13 06/12/2023 0000   ALT 10 06/12/2023 0000   ALKPHOS 77 06/12/2023 0000   BILITOT 0.3 09/02/2022 0844      Component Value Date/Time   TSH 3.67 06/12/2023 0000   TSH 3.080 09/02/2022 0844   Nutritional Lab Results  Component Value Date   VD25OH 57.7 09/02/2022     Assessment and Plan Assessment & Plan Obesity She is adhering to the category two eating plan 25% of  the time and exercises by walking 30 minutes twice weekly. Her weight has been stable over the past month. Zepbound  is more effective than Wegovy  in controlling her appetite, though it occasionally suppresses her desire to eat entirely. Emphasized the importance of maintaining muscle mass and advised prioritizing protein intake to prevent muscle loss. Educated on not skipping meals and ensuring adequate protein intake, especially when appetite is suppressed. Discussed strategies to increase exercise frequency and duration, including setting realistic goals and incorporating enjoyable activities. - Refill Zepbound  prescription and [facility] [address]. - Encourage adherence to the category two eating plan. - Advise prioritizing protein intake in meals. - Encourage increasing exercise to three times a week, with a minimum of 15 minutes on the walk pad or 30 minutes outdoors.  Polyphagia Experiencing polyphagia, managed with Zepbound , which effectively reduces her appetite, though it sometimes leads to a lack of interest in eating. - Continue Zepbound  for appetite control.  Prediabetes She is managing prediabetes through diet, exercise, and weight loss. Discussed the importance of these lifestyle modifications in controlling blood glucose levels and preventing progression to diabetes. - Continue working on diet, exercise, and weight loss to manage prediabetes.  Behavioral Modifications Educated on using ChatGPT for meal planning and prepping to support dietary goals. Provided a recipe for a high-protein dessert using sugar-free Jell-O, cottage cheese, and Cool Whip. Discussed the use of exercise bands for upper body strength and provided instructions for exercises that can be done at home. - Educate on using ChatGPT for meal planning and prepping. - Provide recipe for high-protein dessert using sugar-free Jell-O, cottage cheese, and Cool Whip. - Instruct on exercise band use for upper body  strength.  Follow-up Advised to follow up in four weeks to assess progress with weight management and prediabetes control. - Schedule follow-up appointment in four weeks.   She was informed of the importance of frequent follow up visits to maximize her success with intensive lifestyle modifications for her multiple health conditions.    Jasmine Mesi, MD

## 2023-10-12 ENCOUNTER — Ambulatory Visit: Payer: 59 | Admitting: Adult Health

## 2023-10-15 NOTE — Progress Notes (Signed)
 [facility] [address] [place]. [place] [address] [phone]       OFFICE FOLLOW UP NOTE  Ms. Catherine [name] Date of Birth:  1960-07-13 Medical Record Number:  [id number]    Primary neurologist: Dr. Buck Reason for visit: CPAP follow-up    SUBJECTIVE:   CHIEF COMPLAINT:  Chief Complaint  Patient presents with   Obstructive Sleep Apnea    Rm7, alone, Osa: pt stated that she is compliant to wear cpap greater than 4 + hrs nightly. Doesn't feel adequately rested. Pt stated that she feels like there is air coming out of the back of head when mask is on. FSS score of 18 and ESS was still completing upon my departure     Follow-up visit:  Prior visit: 10/08/2022 with Dr. Buck  Brief HPI:   Catherine [name] is a 63 y.o. female who is followed for OSA on CPAP.  Has longstanding history of sleep apnea and was evaluated by Dr. Buck to restart PAP therapy with prior difficulty tolerating.  ESS 18/24.  Completed HST 07/2022 which showed severe OSA with total AHI of 32.3/h and O2 nadir of 71%.  AutoPap therapy set up 07/2022.   At prior visit with Dr. Buck, noted excellent compliance and optimal residual AHI.  Had some difficulty tolerating mask and recommended change of interface.  ESS 7/24.     Interval history:  Returns today for follow-up CPAP visit.  She did receive a nasal pillow mask which she is tolerating better than FFM. She still struggles at times with sleep position as she previously slept on her stomach and now was laying on her back, occasionally she will lay on her side but this can cause issue with mask seal. She feels she is gradually adjusting to treatment. She does note improvement of daytime energy levels, ESS 6/24.  Recently went to the beach and did not fall asleep on the ride there which she previously would have. She continues to follow with healthy weight and wellness working on weight loss, currently on Zepbound , she has lost approximately  30 pounds since sleep study completed. She has a goal of losing additional 50 pounds.  DME [facility], up to date on supplies.      ROS:   14 system review of systems performed and negative with exception of those listed in HPI  PMH:  Past Medical History:  Diagnosis Date   Acid reflux    Pre-diabetes    Sleep apnea     PSH:  Past Surgical History:  Procedure Laterality Date   CARPAL TUNNEL RELEASE Left 2020   DILATATION & CURETTAGE/HYSTEROSCOPY WITH TRUECLEAR N/A 07/27/2013   Procedure: DILATATION & CURETTAGE/HYSTEROSCOPY WITH TRUCLEAR;  Surgeon: Randine VEAR Fender, MD;  Location: [facility];  Service: Gynecology;  Laterality: N/A;   ENDOMETRIAL BIOPSY  04/07/2013   neg   HYSTEROSCOPY WITH D & C N/A 09/23/2022   Procedure: DILATATION AND CURETTAGE /HYSTEROSCOPY;  Surgeon: Cleotilde Ronal RAMAN, MD;  Location: [facility];  Service: Gynecology;  Laterality: N/A;    Social History:  Social History   Socioeconomic History   Marital status: Married    Spouse name: Not on file   Number of children: Not on file   Years of education: Not on file   Highest education level: Not on file  Occupational History   Not on file  Tobacco Use   Smoking status: Former   Smokeless tobacco: Never   Tobacco comments:  As a teen  Vaping Use   Vaping status: Never Used  Substance and Sexual Activity   Alcohol use: No   Drug use: No   Sexual activity: Yes    Partners: Male    Birth control/protection: Post-menopausal    Comment: husband vasectomy  Other Topics Concern   Not on file  Social History Narrative   Caffeine: maybe 3 cups/day (coffee in AM and then maybe tea)   Right handed   Social Drivers of Corporate investment banker Strain: Not on file  Food Insecurity: Not on file  Transportation Needs: Not on file  Physical Activity: Not on file  Stress: Not on file  Social Connections: Not on file  Intimate Partner Violence: Not on file    Family History:  Family  History  Problem Relation Age of Onset   Obesity Mother    Breast cancer Mother    Cancer Mother        lung   Obesity Father    Hyperlipidemia Father    Cancer Father        pancreatic   Heart disease Father    Diabetes Father    Sleep apnea Brother    Cancer Maternal Grandmother    Cancer Paternal Grandmother        pancreatic    Medications:   Current Outpatient Medications on File Prior to Visit  Medication Sig Dispense Refill   cetirizine (ZYRTEC) 10 MG tablet Take 10 mg by mouth daily.     fluticasone (FLONASE) 50 MCG/ACT nasal spray Place 2 sprays into both nostrils daily.     pantoprazole (PROTONIX) 40 MG tablet Take 40 mg by mouth daily.     tirzepatide  (ZEPBOUND ) 12.5 MG/0.5ML Pen Inject 12.5 mg into the skin once a week. 2 mL 0   No current facility-administered medications on file prior to visit.    Allergies:  No Known Allergies    OBJECTIVE:  Physical Exam  Vitals:   10/16/23 1000 10/16/23 1005  BP: (!) 137/90 (!) 129/90  Pulse:  80  Weight:  205 lb 3.2 oz (93.1 kg)  Height:  5' 1 (1.549 m)   Body mass index is 38.77 kg/m. No results found.   General: well developed, well nourished, very pleasant middle-age Caucasian female, seated, in no evident distress Head: head normocephalic and atraumatic.   Neck: supple with no carotid or supraclavicular bruits Cardiovascular: regular rate and rhythm, no murmurs  Neurologic Exam Mental Status: Awake and fully alert. Oriented to place and time. Recent and remote memory intact. Attention span, concentration and fund of knowledge appropriate. Mood and affect appropriate.  Cranial Nerves: Pupils equal, briskly reactive to light. Extraocular movements full without nystagmus. Visual fields full to confrontation. Hearing intact. Facial sensation intact. Face, tongue, palate moves normally and symmetrically.  Motor: Normal bulk and tone. Normal strength in all tested extremity muscles Gait and Station: Arises  from chair without difficulty. Stance is normal. Gait demonstrates normal stride length and balance without use of AD.         ASSESSMENT/PLAN: Catherine [name] is a 63 y.o. year old female    OSA on CPAP :  Compliance report shows satisfactory usage with optimal residual AHI.   Continue current pressure settings 7-13 with EPR 3 Congratulated on current weight loss accomplishments, consider repeat sleep study once at weight loss goal to see if apnea is still present Discussed continued nightly usage with ensuring greater than 4 hours nightly for optimal benefit  and per insurance purposes.   Continue to follow with DME company for any needed supplies or CPAP related concerns DME [facility] CPAP set up 07/2022, due for new machine 07/2027     Follow up in 1 year or call earlier if needed   CC:  PCP: Avva, Ravisankar, MD    I personally spent a total of 15 minutes in the care of the patient today including preparing to see the patient, performing a medically appropriate exam/evaluation, counseling and educating, placing orders, and documenting clinical information in the EHR.     Harlene Bogaert, AGNP-BC  [facility] [address] [place], [place] [address]  Phone [phone] Fax [phone] Note: This document was prepared with digital dictation and possible smart phrase technology. Any transcriptional errors that result from this process are unintentional.

## 2023-10-16 ENCOUNTER — Ambulatory Visit (INDEPENDENT_AMBULATORY_CARE_PROVIDER_SITE_OTHER): Admitting: Adult Health

## 2023-10-16 ENCOUNTER — Encounter: Payer: Self-pay | Admitting: Adult Health

## 2023-10-16 VITALS — BP 129/90 | HR 80 | Ht 61.0 in | Wt 205.2 lb

## 2023-10-16 DIAGNOSIS — G4733 Obstructive sleep apnea (adult) (pediatric): Secondary | ICD-10-CM

## 2023-10-17 ENCOUNTER — Other Ambulatory Visit (INDEPENDENT_AMBULATORY_CARE_PROVIDER_SITE_OTHER): Payer: Self-pay | Admitting: Family Medicine

## 2023-10-17 DIAGNOSIS — Z6841 Body Mass Index (BMI) 40.0 and over, adult: Secondary | ICD-10-CM

## 2023-10-19 ENCOUNTER — Encounter (INDEPENDENT_AMBULATORY_CARE_PROVIDER_SITE_OTHER): Payer: Self-pay | Admitting: Family Medicine

## 2023-10-20 ENCOUNTER — Other Ambulatory Visit (INDEPENDENT_AMBULATORY_CARE_PROVIDER_SITE_OTHER): Payer: Self-pay | Admitting: Family Medicine

## 2023-10-20 ENCOUNTER — Other Ambulatory Visit (INDEPENDENT_AMBULATORY_CARE_PROVIDER_SITE_OTHER): Payer: Self-pay

## 2023-10-20 DIAGNOSIS — Z6841 Body Mass Index (BMI) 40.0 and over, adult: Secondary | ICD-10-CM

## 2023-10-20 MED ORDER — ZEPBOUND 12.5 MG/0.5ML ~~LOC~~ SOAJ: 12.5000 mg | SUBCUTANEOUS | 0 refills | Status: DC

## 2023-10-20 NOTE — Telephone Encounter (Signed)
 Yes, please rf x 1

## 2023-10-21 ENCOUNTER — Other Ambulatory Visit (INDEPENDENT_AMBULATORY_CARE_PROVIDER_SITE_OTHER): Payer: Self-pay | Admitting: Family Medicine

## 2023-10-21 DIAGNOSIS — Z6841 Body Mass Index (BMI) 40.0 and over, adult: Secondary | ICD-10-CM

## 2023-10-27 ENCOUNTER — Ambulatory Visit (HOSPITAL_BASED_OUTPATIENT_CLINIC_OR_DEPARTMENT_OTHER): Payer: 59 | Admitting: Obstetrics & Gynecology

## 2023-11-02 ENCOUNTER — Ambulatory Visit (INDEPENDENT_AMBULATORY_CARE_PROVIDER_SITE_OTHER): Admitting: Family Medicine

## 2023-11-02 ENCOUNTER — Encounter (INDEPENDENT_AMBULATORY_CARE_PROVIDER_SITE_OTHER): Payer: Self-pay | Admitting: Family Medicine

## 2023-11-02 VITALS — BP 111/72 | HR 72 | Temp 97.7°F | Ht 61.0 in | Wt 204.0 lb

## 2023-11-02 DIAGNOSIS — Z6841 Body Mass Index (BMI) 40.0 and over, adult: Secondary | ICD-10-CM

## 2023-11-02 DIAGNOSIS — G4709 Other insomnia: Secondary | ICD-10-CM

## 2023-11-02 DIAGNOSIS — Z6838 Body mass index (BMI) 38.0-38.9, adult: Secondary | ICD-10-CM | POA: Diagnosis not present

## 2023-11-02 DIAGNOSIS — E669 Obesity, unspecified: Secondary | ICD-10-CM

## 2023-11-02 DIAGNOSIS — G4700 Insomnia, unspecified: Secondary | ICD-10-CM | POA: Diagnosis not present

## 2023-11-02 DIAGNOSIS — R632 Polyphagia: Secondary | ICD-10-CM

## 2023-11-02 MED ORDER — SEMAGLUTIDE-WEIGHT MANAGEMENT 1.7 MG/0.75ML ~~LOC~~ SOAJ: 1.7000 mg | SUBCUTANEOUS | 0 refills | Status: DC

## 2023-11-02 NOTE — Progress Notes (Signed)
 Office: [phone]  /  Fax: [phone]  WEIGHT SUMMARY AND BIOMETRICS  Anthropometric Measurements Height: 5' 1 (1.549 m) Weight: 204 lb (92.5 kg) BMI (Calculated): 38.57 Weight at Last Visit: 199 lb Weight Lost Since Last Visit: 0 Weight Gained Since Last Visit: 5 lb Starting Weight: 226 lb Total Weight Loss (lbs): 22 lb (9.979 kg) Peak Weight: 241 lb   Body Composition  Body Fat %: 48.8 % Fat Mass (lbs): 99.6 lbs Muscle Mass (lbs): 99.2 lbs Total Body Water (lbs): 79.2 lbs Visceral Fat Rating : 15   Other Clinical Data Fasting: no Labs: no Today's Visit #: 16 Starting Date: 09/02/22    Chief Complaint: OBESITY   History of Present Illness Catherine [name] is a 63 year old female who presents for obesity treatment and progress assessment.  She is following a category two eating plan but adheres to it only about 25% of the time. She is attempting to incorporate physical activity by walking for 20 minutes twice a week. She has previously been on Wegovy  and Zepbound  but is experiencing issues with insurance coverage for these medications.  She experiences challenges with cravings, particularly for sweets. Having sweets in the house leads to consumption even without cravings. She reports eating sweets to feel better.  Regarding her sleep, she uses a CPAP machine but feels it does not help her sleep. She occasionally takes Tylenol  PM, which helps her sleep well, but she tries to limit its use to once or twice a week. She experiences dry mouth, which she attributes to the CPAP, as it occurs at night and not during the day. She has not discussed this with her sleep doctor yet.  She mentions gaining weight recently and identifies a need to become more serious about her eating and exercise habits. She does not weigh herself often but noticed a weight gain of about five pounds at a recent doctor's appointment. She has been trying to improve her meal planning and preparation  to avoid poor dietary choices.      PHYSICAL EXAM:  Blood pressure 111/72, pulse 72, temperature 97.7 F (36.5 C), height 5' 1 (1.549 m), weight 204 lb (92.5 kg), last menstrual period 04/02/2018, SpO2 94%. Body mass index is 38.55 kg/m.  DIAGNOSTIC DATA REVIEWED:  BMET    Component Value Date/Time   NA 142 06/12/2023 0000   K 3.7 06/12/2023 0000   CL 108 06/12/2023 0000   CO2 27 (A) 06/12/2023 0000   GLUCOSE 88 09/02/2022 0844   BUN 13 06/12/2023 0000   CREATININE 0.5 06/12/2023 0000   CREATININE 0.75 09/02/2022 0844   CALCIUM 8.4 (A) 06/12/2023 0000   Lab Results  Component Value Date   HGBA1C 5.5 06/12/2023   HGBA1C 6.1 (H) 09/02/2022   Lab Results  Component Value Date   INSULIN  28.7 (H) 09/02/2022   Lab Results  Component Value Date   TSH 3.67 06/12/2023   CBC    Component Value Date/Time   WBC 10.1 06/12/2023 0000   WBC 9.5 09/23/2022 0908   RBC 5.1 06/12/2023 0000   HGB 13.5 06/12/2023 0000   HGB 13.2 09/02/2022 0844   HCT 41 06/12/2023 0000   HCT 42.0 09/02/2022 0844   PLT 240 06/12/2023 0000   PLT 297 09/02/2022 0844   MCV 83.5 09/23/2022 0908   MCV 84 09/02/2022 0844   MCH 26.4 09/23/2022 0908   MCHC 31.6 09/23/2022 0908   RDW 15.2 09/23/2022 0908   RDW 15.0 09/02/2022 0844  Iron Studies No results found for: IRON, TIBC, FERRITIN, IRONPCTSAT Lipid Panel     Component Value Date/Time   CHOL 135 06/12/2023 0000   CHOL 136 09/02/2022 0844   TRIG 62 06/12/2023 0000   HDL 48 06/12/2023 0000   HDL 47 09/02/2022 0844   LDLCALC 75 06/12/2023 0000   LDLCALC 73 09/02/2022 0844   Hepatic Function Panel     Component Value Date/Time   PROT 6.9 09/02/2022 0844   ALBUMIN 4.1 06/12/2023 0000   ALBUMIN 4.3 09/02/2022 0844   AST 13 06/12/2023 0000   ALT 10 06/12/2023 0000   ALKPHOS 77 06/12/2023 0000   BILITOT 0.3 09/02/2022 0844      Component Value Date/Time   TSH 3.67 06/12/2023 0000   TSH 3.080 09/02/2022 0844    Nutritional Lab Results  Component Value Date   VD25OH 57.7 09/02/2022     Assessment and Plan Assessment & Plan Obesity She struggles with obesity, adhering to a category two eating plan only 25% of the time and walking 20 minutes twice weekly. Previously on Wegovy  and Zepbound , she faces insurance coverage issues; Wegovy  is covered, but Zepbound  is not. Weight gain is attributed to challenges with sweets and emotional eating. Emphasized meal planning and adequate protein intake to manage cravings and prevent overeating. Discussed Wegovy 's potential side effects, including queasiness if meals are skipped or high in carbohydrates. The prescribed Wegovy  dose is slightly stronger than Zepbound  to maintain continuity in GLP-1 therapy without restarting at a lower dose. - Prescribe Wegovy  1.7 mg as it is covered by insurance and is slightly stronger than Zepbound . - Encourage meal planning with a focus on high-protein meals to manage cravings. - Advise on strategies to avoid keeping sweets in the house to reduce temptation. - Recommend using resources like allrecipes.com or Chat[employer]PT for high-protein meal ideas. - Encourage recording food intake to aid dietary adherence.  Insomnia She uses a CPAP machine but feels it does not aid sleep. Occasionally uses Tylenol  PM, which helps, but advised to use only diphenhydramine to avoid unnecessary acetaminophen . Discussed potential tolerance to diphenhydramine with regular use and advised against nightly use to maintain effectiveness. Experiences dry mouth, likely due to CPAP use, but it is not persistent throughout the day. - Advise using diphenhydramine alone instead of Tylenol  PM for sleep, as needed, but not nightly to avoid tolerance. - Discuss dry mouth with sleep specialist at next visit.  General Health Maintenance Encouraged lifestyle Behavioral modifications to support weight management and overall health, emphasizing meal planning, protein  intake, and exercise. - Encourage regular exercise, aiming for more than 20 minutes twice a week. - Focus on meal planning and ensuring adequate protein intake to support weight management.    She was informed of the importance of frequent follow up visits to maximize her success with intensive lifestyle modifications for her multiple health conditions.    Louann Penton, MD

## 2023-11-24 ENCOUNTER — Encounter (HOSPITAL_BASED_OUTPATIENT_CLINIC_OR_DEPARTMENT_OTHER): Payer: Self-pay | Admitting: Obstetrics & Gynecology

## 2023-11-24 ENCOUNTER — Ambulatory Visit (INDEPENDENT_AMBULATORY_CARE_PROVIDER_SITE_OTHER): Admitting: Obstetrics & Gynecology

## 2023-11-24 ENCOUNTER — Encounter (HOSPITAL_BASED_OUTPATIENT_CLINIC_OR_DEPARTMENT_OTHER): Payer: Self-pay

## 2023-11-24 VITALS — BP 138/88 | HR 79 | Ht 61.0 in | Wt 203.0 lb

## 2023-11-24 DIAGNOSIS — Z01411 Encounter for gynecological examination (general) (routine) with abnormal findings: Secondary | ICD-10-CM | POA: Diagnosis not present

## 2023-11-24 DIAGNOSIS — Z01419 Encounter for gynecological examination (general) (routine) without abnormal findings: Secondary | ICD-10-CM

## 2023-11-24 DIAGNOSIS — N816 Rectocele: Secondary | ICD-10-CM

## 2023-11-24 DIAGNOSIS — Z780 Asymptomatic menopausal state: Secondary | ICD-10-CM

## 2023-11-24 NOTE — Progress Notes (Signed)
 ANNUAL EXAM Patient name: Catherine [name] MRN [id number]  Date of birth: Mar 23, 1961 Chief Complaint:   Gynecologic Exam  History of Present Illness:   Catherine [name] is a 63 y.o. [phone] Caucasian female being seen today for a routine annual exam.  Doing well.  Denies vaginal bleeding.  On Wegovy .  Has lost around 40 pounds.  She is going to [facility].    Sees Dr. Janey.  Last appt was 06/2023.  Had blood work then.    Has noticed some increased urinary urgency.    Patient's last menstrual period was 04/02/2018.  Last pap 06/2022. Results were: NILM w/ HRHPV negative. H/O abnormal pap: remote hx Last mammogram: 05/2022. Results were: normal. Family h/o breast cancer: yes mother Last colonoscopy: 2018. Results were: normal. Family h/o colorectal cancer: no     11/24/2023    9:18 AM 10/22/2022   10:14 AM 08/05/2022    2:11 PM  Depression screen PHQ 2/9  Decreased Interest 0 0 0  Down, Depressed, Hopeless 0 0 0  PHQ - 2 Score 0 0 0    Review of Systems:   Pertinent items are noted in HPI Denies any vaginal bleeding.  Denies pelvic pain.   Pertinent History Reviewed:  Reviewed past medical,surgical, social and family history.  Reviewed problem list, medications and allergies. Physical Assessment:   Vitals:   11/24/23 0910 11/24/23 1001  BP: (!) 139/99 138/88  Pulse: 79   SpO2: 98%   Weight: 203 lb (92.1 kg)   Height: 5' 1 (1.549 m)   Body mass index is 38.36 kg/m.        Physical Examination:   General appearance - well appearing, and in no distress  Mental status - alert, oriented to person, place, and time  Psych:  She has a normal mood and affect  Skin/ - warm and dry, normal color, no suspicious lesions noted  Chest - effort normal, all lung fields clear to auscultation bilaterally  Heart - normal rate and regular rhythm  Neck:  midline trachea, no thyromegaly or nodules  Breasts - breasts appear normal, no suspicious masses, no skin or nipple  changes or  axillary nodes  Abdomen - soft, nontender, nondistended, no masses or organomegaly  Pelvic - VULVA: normal appearing vulva with no masses, tenderness or lesions   VAGINA: normal appearing vagina with normal color and discharge, no lesions, 2nd degree rectocele noted on exam   CERVIX: normal appearing cervix without discharge or lesions, no CMT  Thin prep pap is not done as not indicated today  UTERUS: uterus is felt to be normal size, shape, consistency and nontender   ADNEXA: No adnexal masses or tenderness noted.  Rectal - normal rectal, good sphincter tone, no masses felt, small hemorrhoid noted  Extremities:  No swelling or varicosities noted  Chaperone present for exam  No results found for this or any previous visit (from the past 24 hours).  Assessment & Plan:  1. Well woman exam with routine gynecological exam (Primary) - Pap smear neg with neg 2024 - Mammogram done this year.  She reports this was done at [facility].   - Colonoscopy 2018.  Follow up 10 years.  - Bone mineral density done with Dr. Janey - lab work done with PCP, Dr. Janey - vaccines reviewed/updated  2. Postmenopausal - not on HRT  3.  Rectocele - exam findings discussed with pt.  She is asymptomatic.  No treatment recommended at this time.  No orders of the defined types were placed in this encounter.   Meds: No orders of the defined types were placed in this encounter.   Follow-up: Return in about 1 year (around 11/23/2024).  Ronal GORMAN Pinal, MD 11/24/2023 10:01 AM

## 2023-11-27 ENCOUNTER — Other Ambulatory Visit (INDEPENDENT_AMBULATORY_CARE_PROVIDER_SITE_OTHER): Payer: Self-pay | Admitting: Family Medicine

## 2023-12-01 ENCOUNTER — Ambulatory Visit (INDEPENDENT_AMBULATORY_CARE_PROVIDER_SITE_OTHER): Admitting: Family Medicine

## 2023-12-01 ENCOUNTER — Encounter (INDEPENDENT_AMBULATORY_CARE_PROVIDER_SITE_OTHER): Payer: Self-pay | Admitting: Family Medicine

## 2023-12-01 VITALS — BP 101/67 | HR 69 | Temp 97.7°F | Ht 61.0 in | Wt 199.0 lb

## 2023-12-01 DIAGNOSIS — R632 Polyphagia: Secondary | ICD-10-CM

## 2023-12-01 DIAGNOSIS — Z6838 Body mass index (BMI) 38.0-38.9, adult: Secondary | ICD-10-CM

## 2023-12-01 DIAGNOSIS — Z6837 Body mass index (BMI) 37.0-37.9, adult: Secondary | ICD-10-CM

## 2023-12-01 DIAGNOSIS — K2100 Gastro-esophageal reflux disease with esophagitis, without bleeding: Secondary | ICD-10-CM

## 2023-12-01 DIAGNOSIS — E669 Obesity, unspecified: Secondary | ICD-10-CM | POA: Diagnosis not present

## 2023-12-01 DIAGNOSIS — Z6841 Body Mass Index (BMI) 40.0 and over, adult: Secondary | ICD-10-CM

## 2023-12-01 MED ORDER — SEMAGLUTIDE-WEIGHT MANAGEMENT 1.7 MG/0.75ML ~~LOC~~ SOAJ: 1.7000 mg | SUBCUTANEOUS | 0 refills | Status: DC

## 2023-12-01 NOTE — Progress Notes (Signed)
 Office: [phone]  /  Fax: [phone]  WEIGHT SUMMARY AND BIOMETRICS  Anthropometric Measurements Height: 5' 1 (1.549 m) Weight: 199 lb (90.3 kg) BMI (Calculated): 37.62 Weight at Last Visit: 204 lb Weight Lost Since Last Visit: 5 lb Weight Gained Since Last Visit: 0 Starting Weight: 226 lb Total Weight Loss (lbs): 27 lb (12.2 kg) Peak Weight: 242 lb   Body Composition  Body Fat %: 46.1 % Fat Mass (lbs): 91.8 lbs Muscle Mass (lbs): 101.8 lbs Total Body Water (lbs): 74.2 lbs Visceral Fat Rating : 14   Other Clinical Data Fasting: yes Labs: no Today's Visit #: 17 Starting Date: 09/02/22    Chief Complaint: OBESITY    History of Present Illness Catherine [name] is a 63 year old female who presents for obesity treatment and progress assessment.  She has been adhering to the category two plan approximately 25% of the time and has incorporated walking for 20 to 30 minutes once a week, resulting in a weight loss of five pounds over the past month.  She uses a meal delivery service, [employer], three days a week.  She is currently on Wegovy  for weight management and experiences occasional gastrointestinal issues, such as needing to use the bathroom shortly after eating, but no significant nausea. She was previously on a 2.4 mg dose of Wegovy  in January and is due for her next dose today.  She continues to experience heartburn, described as a growling sensation in her chest, which she has had for years. She takes Protonix for this condition and finds the heartburn annoying, especially in quiet settings.  No significant issues with allergies at present. She mentions that her stress levels are manageable and enjoys the cooler weather for walking, although she faces challenges with mosquitoes and gnats in her area.      PHYSICAL EXAM:  Blood pressure 101/67, pulse 69, temperature 97.7 F (36.5 C), height 5' 1 (1.549 m), weight 199 lb (90.3 kg), last menstrual period  04/02/2018, SpO2 96%. Body mass index is 37.6 kg/m.  DIAGNOSTIC DATA REVIEWED:  BMET    Component Value Date/Time   NA 142 06/12/2023 0000   K 3.7 06/12/2023 0000   CL 108 06/12/2023 0000   CO2 27 (A) 06/12/2023 0000   GLUCOSE 88 09/02/2022 0844   BUN 13 06/12/2023 0000   CREATININE 0.5 06/12/2023 0000   CREATININE 0.75 09/02/2022 0844   CALCIUM 8.4 (A) 06/12/2023 0000   Lab Results  Component Value Date   HGBA1C 5.5 06/12/2023   HGBA1C 6.1 (H) 09/02/2022   Lab Results  Component Value Date   INSULIN  28.7 (H) 09/02/2022   Lab Results  Component Value Date   TSH 3.67 06/12/2023   CBC    Component Value Date/Time   WBC 10.1 06/12/2023 0000   WBC 9.5 09/23/2022 0908   RBC 5.1 06/12/2023 0000   HGB 13.5 06/12/2023 0000   HGB 13.2 09/02/2022 0844   HCT 41 06/12/2023 0000   HCT 42.0 09/02/2022 0844   PLT 240 06/12/2023 0000   PLT 297 09/02/2022 0844   MCV 83.5 09/23/2022 0908   MCV 84 09/02/2022 0844   MCH 26.4 09/23/2022 0908   MCHC 31.6 09/23/2022 0908   RDW 15.2 09/23/2022 0908   RDW 15.0 09/02/2022 0844   Iron Studies No results found for: IRON, TIBC, FERRITIN, IRONPCTSAT Lipid Panel     Component Value Date/Time   CHOL 135 06/12/2023 0000   CHOL 136 09/02/2022 0844   TRIG  62 06/12/2023 0000   HDL 48 06/12/2023 0000   HDL 47 09/02/2022 0844   LDLCALC 75 06/12/2023 0000   LDLCALC 73 09/02/2022 0844   Hepatic Function Panel     Component Value Date/Time   PROT 6.9 09/02/2022 0844   ALBUMIN 4.1 06/12/2023 0000   ALBUMIN 4.3 09/02/2022 0844   AST 13 06/12/2023 0000   ALT 10 06/12/2023 0000   ALKPHOS 77 06/12/2023 0000   BILITOT 0.3 09/02/2022 0844      Component Value Date/Time   TSH 3.67 06/12/2023 0000   TSH 3.080 09/02/2022 0844   Nutritional Lab Results  Component Value Date   VD25OH 57.7 09/02/2022     Assessment and Plan Assessment & Plan Obesity with associated polyphagia Obesity with associated polyphagia, currently  managed with Wegovy . She adheres to the prescribed plan 25% of the time and has lost five pounds in the last month. Occasional gastrointestinal issues with Wegovy  are manageable. The goal is to control excessive hunger while maintaining adequate nutritional intake. Discussed the risks of increasing Wegovy  dosage, including potential nausea and metabolic issues, and decided to maintain the current dose as it is effective. - Refill Wegovy  prescription at [facility] on [address]. - Encourage continuation of meal planning with dinner meals under 600 calories and at least 35 grams of protein. - Increase walking to twice a week as weather permits.  Gastroesophageal reflux disease (GERD) Chronic GERD with symptoms of growling and burping sensations, but no pain. Symptoms are managed with Protonix. Wegovy  may exacerbate heartburn symptoms, particularly in those with pre-existing conditions. - Continue Protonix for GERD management.    She was informed of the importance of frequent follow up visits to maximize her success with intensive lifestyle modifications for her multiple health conditions.    Louann Penton, MD

## 2023-12-08 ENCOUNTER — Telehealth (INDEPENDENT_AMBULATORY_CARE_PROVIDER_SITE_OTHER): Payer: Self-pay | Admitting: *Deleted

## 2023-12-08 NOTE — Telephone Encounter (Signed)
 Catherine [name] (Key: [id number])  [employer] is unable to respond with clinical questions. Please see more information at the bottom of the page for next steps.    Message from Plan Your PA has been resolved, no additional PA is required. For further inquiries please contact the number on the back of the member prescription card. (Message [phone])

## 2023-12-09 ENCOUNTER — Telehealth (INDEPENDENT_AMBULATORY_CARE_PROVIDER_SITE_OTHER): Payer: Self-pay

## 2023-12-10 NOTE — Telephone Encounter (Signed)
 Prior authorization started for Wegovy  1.7 mg.  Awaiting determination.   Message from plan:  Information regarding your request Your PA has been resolved, no additional PA is required. For further inquiries please contact the number on the back of the member prescription card. (Message [phone])

## 2023-12-29 ENCOUNTER — Ambulatory Visit (INDEPENDENT_AMBULATORY_CARE_PROVIDER_SITE_OTHER): Admitting: Family Medicine

## 2023-12-29 ENCOUNTER — Encounter (INDEPENDENT_AMBULATORY_CARE_PROVIDER_SITE_OTHER): Payer: Self-pay | Admitting: Family Medicine

## 2023-12-29 VITALS — BP 110/74 | HR 72 | Temp 97.7°F | Ht 61.0 in | Wt 200.0 lb

## 2023-12-29 DIAGNOSIS — K2100 Gastro-esophageal reflux disease with esophagitis, without bleeding: Secondary | ICD-10-CM | POA: Diagnosis not present

## 2023-12-29 DIAGNOSIS — E669 Obesity, unspecified: Secondary | ICD-10-CM | POA: Diagnosis not present

## 2023-12-29 DIAGNOSIS — R632 Polyphagia: Secondary | ICD-10-CM

## 2023-12-29 DIAGNOSIS — Z6837 Body mass index (BMI) 37.0-37.9, adult: Secondary | ICD-10-CM | POA: Diagnosis not present

## 2023-12-29 DIAGNOSIS — E66813 Obesity, class 3: Secondary | ICD-10-CM

## 2023-12-29 MED ORDER — SEMAGLUTIDE-WEIGHT MANAGEMENT 1.7 MG/0.75ML ~~LOC~~ SOAJ: 1.7000 mg | SUBCUTANEOUS | 0 refills | Status: DC

## 2023-12-29 NOTE — Progress Notes (Signed)
 Office: [phone]  /  Fax: [phone]  WEIGHT SUMMARY AND BIOMETRICS  Anthropometric Measurements Height: 5' 1 (1.549 m) Weight: 200 lb (90.7 kg) BMI (Calculated): 37.81 Weight at Last Visit: 199 lb Weight Lost Since Last Visit: 0 Weight Gained Since Last Visit: 1 lb Starting Weight: 226 lb Total Weight Loss (lbs): 26 lb (11.8 kg) Peak Weight: 242 lb   Body Composition  Body Fat %: 47.2 % Fat Mass (lbs): 94.6 lbs Muscle Mass (lbs): 100.4 lbs Total Body Water (lbs): 76 lbs Visceral Fat Rating : 15   Other Clinical Data Fasting: yes Labs: no Today's Visit #: 18 Starting Date: 09/02/22    Chief Complaint: OBESITY   History of Present Illness Catherine [name] is a 63 year old female with obesity who presents for obesity treatment and progress assessment.  She has been following the category two eating plan but has adhered to it about 25% of the time. She is attempting to increase her physical activity by walking one to two days a week for about 20 minutes. Despite these efforts, she has gained one pound in the last month since her last visit.  She is currently taking Wegovy  at 1.7 mg and is requesting a refill. She does not feel she is getting good hunger control with Wegovy  and has noticed that when her intake of simple carbohydrates increases, the medication does not control her hunger as effectively.  She has a history of GERD and is on Protonix 40 mg daily. She occasionally forgets to take her Protonix, which is supposed to be taken 30 minutes before supper. Her heartburn symptoms have remained about the same.  No significant issues with allergies lately.      PHYSICAL EXAM:  Blood pressure 110/74, pulse 72, temperature 97.7 F (36.5 C), height 5' 1 (1.549 m), weight 200 lb (90.7 kg), last menstrual period 04/02/2018, SpO2 96%. Body mass index is 37.79 kg/m.  DIAGNOSTIC DATA REVIEWED:  BMET    Component Value Date/Time   NA 142 06/12/2023 0000    K 3.7 06/12/2023 0000   CL 108 06/12/2023 0000   CO2 27 (A) 06/12/2023 0000   GLUCOSE 88 09/02/2022 0844   BUN 13 06/12/2023 0000   CREATININE 0.5 06/12/2023 0000   CREATININE 0.75 09/02/2022 0844   CALCIUM 8.4 (A) 06/12/2023 0000   Lab Results  Component Value Date   HGBA1C 5.5 06/12/2023   HGBA1C 6.1 (H) 09/02/2022   Lab Results  Component Value Date   INSULIN  28.7 (H) 09/02/2022   Lab Results  Component Value Date   TSH 3.67 06/12/2023   CBC    Component Value Date/Time   WBC 10.1 06/12/2023 0000   WBC 9.5 09/23/2022 0908   RBC 5.1 06/12/2023 0000   HGB 13.5 06/12/2023 0000   HGB 13.2 09/02/2022 0844   HCT 41 06/12/2023 0000   HCT 42.0 09/02/2022 0844   PLT 240 06/12/2023 0000   PLT 297 09/02/2022 0844   MCV 83.5 09/23/2022 0908   MCV 84 09/02/2022 0844   MCH 26.4 09/23/2022 0908   MCHC 31.6 09/23/2022 0908   RDW 15.2 09/23/2022 0908   RDW 15.0 09/02/2022 0844   Iron Studies No results found for: IRON, TIBC, FERRITIN, IRONPCTSAT Lipid Panel     Component Value Date/Time   CHOL 135 06/12/2023 0000   CHOL 136 09/02/2022 0844   TRIG 62 06/12/2023 0000   HDL 48 06/12/2023 0000   HDL 47 09/02/2022 0844   LDLCALC 75 06/12/2023  0000   LDLCALC 73 09/02/2022 0844   Hepatic Function Panel     Component Value Date/Time   PROT 6.9 09/02/2022 0844   ALBUMIN 4.1 06/12/2023 0000   ALBUMIN 4.3 09/02/2022 0844   AST 13 06/12/2023 0000   ALT 10 06/12/2023 0000   ALKPHOS 77 06/12/2023 0000   BILITOT 0.3 09/02/2022 0844      Component Value Date/Time   TSH 3.67 06/12/2023 0000   TSH 3.080 09/02/2022 0844   Nutritional Lab Results  Component Value Date   VD25OH 57.7 09/02/2022     Assessment and Plan Assessment & Plan Obesity and Polyphagia with recent weight gain of one pound over the last month. Currently on Wegovy  1.7 mg, but reports suboptimal hunger control. Difficulty adhering to the prescribed category two eating plan, following it only 25%  of the time. Engages in minimal physical activity, walking 20 minutes 1-2 days a week. Discussed the importance of meal planning and tracking intake to improve adherence to dietary goals. Encouraged the use of slow cooker recipes to facilitate meal preparation. Discussed the impact of simple carbohydrates on hunger control and the body's response to Wegovy . - Refill Wegovy  1.7 mg prescription. - Encourage adherence to meal planning and tracking using an app or on paper. - Provide packet of slow cooker recipes to aid in meal preparation. - Encourage increased physical activity, aiming for more frequent walking sessions. - Discuss the importance of reducing simple carbohydrate intake to enhance Wegovy 's effectiveness.  Gastroesophageal reflux disease (GERD) without esophagitis GERD symptoms remain unchanged. Currently on Protonix 40 mg daily, but adherence is inconsistent. Discussed the importance of taking Protonix 30 minutes before supper for optimal effectiveness. Noted that taking it in the morning is still beneficial if adherence to the evening schedule is challenging. Discussed potential exacerbation of GERD symptoms due to GLP-1 medications like Wegovy , which slow gastric emptying. - Encourage consistent use of Protonix 40 mg, ideally 30 minutes before supper. - Advise taking Protonix in the morning if evening adherence is difficult as it is better than missing her medicine altogether.SABRA Kubas was informed of the importance of frequent follow up visits to maximize her success with intensive lifestyle modifications for her obesity and obesity related health conditions as recommended by USPSTF and CMS guidelines   Louann Penton, MD

## 2024-01-05 ENCOUNTER — Encounter (HOSPITAL_BASED_OUTPATIENT_CLINIC_OR_DEPARTMENT_OTHER): Payer: Self-pay | Admitting: Obstetrics & Gynecology

## 2024-01-26 ENCOUNTER — Ambulatory Visit (INDEPENDENT_AMBULATORY_CARE_PROVIDER_SITE_OTHER): Admitting: Family Medicine

## 2024-01-26 ENCOUNTER — Encounter (INDEPENDENT_AMBULATORY_CARE_PROVIDER_SITE_OTHER): Payer: Self-pay | Admitting: Family Medicine

## 2024-01-26 VITALS — BP 109/73 | HR 74 | Temp 97.8°F | Ht 61.0 in | Wt 197.0 lb

## 2024-01-26 DIAGNOSIS — E559 Vitamin D deficiency, unspecified: Secondary | ICD-10-CM | POA: Diagnosis not present

## 2024-01-26 DIAGNOSIS — Z6837 Body mass index (BMI) 37.0-37.9, adult: Secondary | ICD-10-CM

## 2024-01-26 DIAGNOSIS — E66813 Obesity, class 3: Secondary | ICD-10-CM | POA: Diagnosis not present

## 2024-01-26 DIAGNOSIS — K2100 Gastro-esophageal reflux disease with esophagitis, without bleeding: Secondary | ICD-10-CM

## 2024-01-26 DIAGNOSIS — R7303 Prediabetes: Secondary | ICD-10-CM | POA: Diagnosis not present

## 2024-01-26 DIAGNOSIS — Z6841 Body Mass Index (BMI) 40.0 and over, adult: Secondary | ICD-10-CM

## 2024-01-26 MED ORDER — SEMAGLUTIDE-WEIGHT MANAGEMENT 1.7 MG/0.75ML ~~LOC~~ SOAJ: 1.7000 mg | SUBCUTANEOUS | 0 refills | Status: DC

## 2024-01-26 NOTE — Progress Notes (Signed)
 Office: [phone]  /  Fax: [phone]  WEIGHT SUMMARY AND BIOMETRICS  Anthropometric Measurements Height: 5' 1 (1.549 m) Weight: 197 lb (89.4 kg) BMI (Calculated): 37.24 Weight at Last Visit: 197 lb Weight Lost Since Last Visit: 3 lb Weight Gained Since Last Visit: 0 Starting Weight: 226 lb Total Weight Loss (lbs): 29 lb (13.2 kg) Peak Weight: 242 lb   Body Composition  Body Fat %: 46 % Fat Mass (lbs): 90.6 lbs Muscle Mass (lbs): 101 lbs Total Body Water (lbs): 73.8 lbs Visceral Fat Rating : 14   Other Clinical Data Fasting: yes Labs: yes Today's Visit #: 19 Starting Date: 09/02/22    Chief Complaint: OBESITY   Discussed the use of AI scribe software for clinical note transcription with the patient, who gave verbal consent to proceed.  History of Present Illness Catherine [name] is a 63 year old female who presents to discuss her obesity plan and assess her progress.  She is following a category two eating plan, adhering to it about 25% of the time. She is working on increasing her intake of fruits, vegetables, and protein but struggles with hydration and occasionally skips meals. She has lost three pounds in the last month.  She is currently on Lexiva 1.7 mg for polyphagia and requests a refill. She also takes Protonix for GERD, which is being managed with diet, exercise, and medication.  She walks for exercise once a week for 20 to 30 minutes. Her sleep is inconsistent; she sometimes sleeps through the night but often wakes up around 3 AM and has difficulty returning to sleep.  She experienced a stomach ache last Wednesday, starting at the breastbone and moving downward, but it was not consistent with her usual GERD symptoms. No recent GI issues except for this episode of stomach pain.  She plans to attend a 'trunk or treat' event at her church and intends to eat before attending to avoid temptation from hot dogs and candy. She is not significantly tempted  by candy.  She occasionally babysits her grandchildren, including a seven-year-old, a five-year-old, a two-year-old, and a one-year-old. She keeps the baby once a week and the others as needed.      PHYSICAL EXAM:  Blood pressure 109/73, pulse 74, temperature 97.8 F (36.6 C), height 5' 1 (1.549 m), weight 197 lb (89.4 kg), last menstrual period 04/02/2018, SpO2 92%. Body mass index is 37.22 kg/m.  DIAGNOSTIC DATA REVIEWED:  BMET    Component Value Date/Time   NA 142 06/12/2023 0000   K 3.7 06/12/2023 0000   CL 108 06/12/2023 0000   CO2 27 (A) 06/12/2023 0000   GLUCOSE 88 09/02/2022 0844   BUN 13 06/12/2023 0000   CREATININE 0.5 06/12/2023 0000   CREATININE 0.75 09/02/2022 0844   CALCIUM 8.4 (A) 06/12/2023 0000   Lab Results  Component Value Date   HGBA1C 5.5 06/12/2023   HGBA1C 6.1 (H) 09/02/2022   Lab Results  Component Value Date   INSULIN  28.7 (H) 09/02/2022   Lab Results  Component Value Date   TSH 3.67 06/12/2023   CBC    Component Value Date/Time   WBC 10.1 06/12/2023 0000   WBC 9.5 09/23/2022 0908   RBC 5.1 06/12/2023 0000   HGB 13.5 06/12/2023 0000   HGB 13.2 09/02/2022 0844   HCT 41 06/12/2023 0000   HCT 42.0 09/02/2022 0844   PLT 240 06/12/2023 0000   PLT 297 09/02/2022 0844   MCV 83.5 09/23/2022 0908   MCV  84 09/02/2022 0844   MCH 26.4 09/23/2022 0908   MCHC 31.6 09/23/2022 0908   RDW 15.2 09/23/2022 0908   RDW 15.0 09/02/2022 0844   Iron Studies No results found for: IRON, TIBC, FERRITIN, IRONPCTSAT Lipid Panel     Component Value Date/Time   CHOL 135 06/12/2023 0000   CHOL 136 09/02/2022 0844   TRIG 62 06/12/2023 0000   HDL 48 06/12/2023 0000   HDL 47 09/02/2022 0844   LDLCALC 75 06/12/2023 0000   LDLCALC 73 09/02/2022 0844   Hepatic Function Panel     Component Value Date/Time   PROT 6.9 09/02/2022 0844   ALBUMIN 4.1 06/12/2023 0000   ALBUMIN 4.3 09/02/2022 0844   AST 13 06/12/2023 0000   ALT 10 06/12/2023 0000    ALKPHOS 77 06/12/2023 0000   BILITOT 0.3 09/02/2022 0844      Component Value Date/Time   TSH 3.67 06/12/2023 0000   TSH 3.080 09/02/2022 0844   Nutritional Lab Results  Component Value Date   VD25OH 57.7 09/02/2022     Assessment and Plan Assessment & Plan Class 3 obesity Following the category two eating plan approximately 25% of the time. Mindful of eating habits, working on increasing fruits, vegetables, and protein intake. Struggling with hydration and occasionally skipping meals. Walking for exercise once a week for 20-30 minutes. Lost three pounds in the last month. On Wegovy  for weight management, experiencing occasional stomach upset, possibly related to skipping meals or high carbohydrate intake. Aware of the need to eat supper before attending events to avoid temptation. - Continue category two eating plan - Encouraged increased intake of fruits, vegetables, and protein - Encouraged adequate hydration - Encouraged regular meal consumption - Continue walking for exercise once a week - Refilled Wegovy  prescription  Prediabetes Previous improvement in A1c and insulin  levels. Monitoring required to assess current status. - Ordered A1c and insulin  levels  Gastroesophageal reflux disease (GERD) GERD symptoms well-managed with Protonix. Occasional stomach upset, possibly related to Wegovy  or dietary habits. Aware of the difference between GERD symptoms and stomach upset from Wegovy . - Continue Protonix for GERD management  Vitamin D  deficiency Vitamin D  levels need assessment. Occasionally takes a children's multivitamin with iron. - Ordered vitamin D  level      Catherine [name] was informed of the importance of frequent follow up visits to maximize her success with intensive lifestyle modifications for her obesity and obesity related health conditions as recommended by USPSTF and CMS guidelines   Catherine Penton, MD

## 2024-01-27 LAB — LIPID PANEL WITH LDL/HDL RATIO
Cholesterol, Total: 145 mg/dL (ref 100–199)
HDL: 53 mg/dL (ref >39)
LDL Chol Calc (NIH): 79 mg/dL (ref 0–99)
LDL/HDL Ratio: 1.5 ratio (ref 0.0–3.2)
Triglycerides: 67 mg/dL (ref 0–149)
VLDL Cholesterol Cal: 13 mg/dL (ref 5–40)

## 2024-01-27 LAB — CMP14+EGFR
ALT: 9 IU/L (ref 0–32)
AST: 15 IU/L (ref 0–40)
Albumin: 4.3 g/dL (ref 3.9–4.9)
Alkaline Phosphatase: 107 IU/L (ref 49–135)
BUN/Creatinine Ratio: 23 (ref 12–28)
BUN: 15 mg/dL (ref 8–27)
Bilirubin Total: 0.3 mg/dL (ref 0.0–1.2)
CO2: 23 mmol/L (ref 20–29)
Calcium: 9.5 mg/dL (ref 8.7–10.3)
Chloride: 104 mmol/L (ref 96–106)
Creatinine, Ser: 0.65 mg/dL (ref 0.57–1.00)
Globulin, Total: 2.3 g/dL (ref 1.5–4.5)
Glucose: 86 mg/dL (ref 70–99)
Potassium: 4.2 mmol/L (ref 3.5–5.2)
Sodium: 142 mmol/L (ref 134–144)
Total Protein: 6.6 g/dL (ref 6.0–8.5)
eGFR: 98 mL/min/1.73 (ref >59)

## 2024-01-27 LAB — VITAMIN B12: Vitamin B-12: 452 pg/mL (ref 232–1245)

## 2024-01-27 LAB — INSULIN, RANDOM: INSULIN: 12.4 u[IU]/mL (ref 2.6–24.9)

## 2024-01-27 LAB — VITAMIN D 25 HYDROXY (VIT D DEFICIENCY, FRACTURES): Vit D, 25-Hydroxy: 38.3 ng/mL (ref 30.0–100.0)

## 2024-01-27 LAB — HEMOGLOBIN A1C
Est. average glucose Bld gHb Est-mCnc: 117 mg/dL
Hgb A1c MFr Bld: 5.7 % — ABNORMAL HIGH (ref 4.8–5.6)

## 2024-02-23 ENCOUNTER — Ambulatory Visit (INDEPENDENT_AMBULATORY_CARE_PROVIDER_SITE_OTHER): Payer: Self-pay | Admitting: Family Medicine

## 2024-02-23 ENCOUNTER — Encounter (INDEPENDENT_AMBULATORY_CARE_PROVIDER_SITE_OTHER): Payer: Self-pay | Admitting: Family Medicine

## 2024-02-23 VITALS — BP 116/79 | HR 74 | Temp 97.8°F | Ht 61.0 in | Wt 200.0 lb

## 2024-02-23 DIAGNOSIS — E559 Vitamin D deficiency, unspecified: Secondary | ICD-10-CM

## 2024-02-23 DIAGNOSIS — Z6837 Body mass index (BMI) 37.0-37.9, adult: Secondary | ICD-10-CM

## 2024-02-23 DIAGNOSIS — R7303 Prediabetes: Secondary | ICD-10-CM

## 2024-02-23 DIAGNOSIS — K2100 Gastro-esophageal reflux disease with esophagitis, without bleeding: Secondary | ICD-10-CM | POA: Diagnosis not present

## 2024-02-23 MED ORDER — WEGOVY 2.4 MG/0.75ML ~~LOC~~ SOAJ: 2.4000 mg | Freq: Every day | SUBCUTANEOUS | 0 refills | Status: DC

## 2024-02-23 MED ORDER — VITAMIN D (ERGOCALCIFEROL) 1.25 MG (50000 UNIT) PO CAPS: 50000.0000 [IU] | ORAL_CAPSULE | ORAL | 0 refills | Status: DC

## 2024-02-23 NOTE — Progress Notes (Signed)
 Office: [phone]  /  Fax: [phone]  WEIGHT SUMMARY AND BIOMETRICS  Anthropometric Measurements Height: 5' 1 (1.549 m) Weight: 200 lb (90.7 kg) BMI (Calculated): 37.81 Weight at Last Visit: 197 lb Weight Lost Since Last Visit: 0 Weight Gained Since Last Visit: 3 lb Starting Weight: 226 lb Total Weight Loss (lbs): 26 lb (11.8 kg) Peak Weight: 242 lb   Body Composition  Body Fat %: 47.3 % Fat Mass (lbs): 94.8 lbs Muscle Mass (lbs): 100.2 lbs Total Body Water (lbs): 75.6 lbs Visceral Fat Rating : 15   Other Clinical Data Fasting: no Labs: no Today's Visit #: 20 Starting Date: 09/02/22    Chief Complaint: OBESITY    History of Present Illness Catherine [name] is a 63 year old female with obesity and GERD who presents for obesity treatment and progress assessment.  She adheres to her prescribed category two eating plan only 25% of the time. Her diet is deficient in fruits, vegetables, and protein, and she frequently skips meals and does not hydrate adequately. Physical activity is limited to walking for 20 to 30 minutes once a week. She has gained three pounds in the last month since her last visit.  She is currently on Wegovy  1.7 mg for polyphagia and requests a refill. Sweets are a particular challenge due to their availability.  In addition to obesity, she is being treated for GERD with Protonix 40 mg daily. Coffee in the morning seems to activate her heartburn, leading to throat clearing. She sometimes takes Protonix in the morning if she forgets to take it at night, noting that it still provides some relief even if taken after eating.  Her recent blood work shows a slight increase in A1c. She is not currently on vitamin D  supplements, but her vitamin D  level has fallen slightly.      PHYSICAL EXAM:  Blood pressure 116/79, pulse 74, temperature 97.8 F (36.6 C), height 5' 1 (1.549 m), weight 200 lb (90.7 kg), last menstrual period 04/02/2018, SpO2  93%. Body mass index is 37.79 kg/m.  DIAGNOSTIC DATA REVIEWED:  BMET    Component Value Date/Time   NA 142 01/26/2024 1026   K 4.2 01/26/2024 1026   CL 104 01/26/2024 1026   CO2 23 01/26/2024 1026   GLUCOSE 86 01/26/2024 1026   BUN 15 01/26/2024 1026   CREATININE 0.65 01/26/2024 1026   CALCIUM 9.5 01/26/2024 1026   Lab Results  Component Value Date   HGBA1C 5.7 (H) 01/26/2024   HGBA1C 6.1 (H) 09/02/2022   Lab Results  Component Value Date   INSULIN  12.4 01/26/2024   INSULIN  28.7 (H) 09/02/2022   Lab Results  Component Value Date   TSH 3.67 06/12/2023   CBC    Component Value Date/Time   WBC 10.1 06/12/2023 0000   WBC 9.5 09/23/2022 0908   RBC 5.1 06/12/2023 0000   HGB 13.5 06/12/2023 0000   HGB 13.2 09/02/2022 0844   HCT 41 06/12/2023 0000   HCT 42.0 09/02/2022 0844   PLT 240 06/12/2023 0000   PLT 297 09/02/2022 0844   MCV 83.5 09/23/2022 0908   MCV 84 09/02/2022 0844   MCH 26.4 09/23/2022 0908   MCHC 31.6 09/23/2022 0908   RDW 15.2 09/23/2022 0908   RDW 15.0 09/02/2022 0844   Iron Studies No results found for: IRON, TIBC, FERRITIN, IRONPCTSAT Lipid Panel     Component Value Date/Time   CHOL 145 01/26/2024 1026   TRIG 67 01/26/2024 1026   HDL  53 01/26/2024 1026   LDLCALC 79 01/26/2024 1026   Hepatic Function Panel     Component Value Date/Time   PROT 6.6 01/26/2024 1026   ALBUMIN 4.3 01/26/2024 1026   AST 15 01/26/2024 1026   ALT 9 01/26/2024 1026   ALKPHOS 107 01/26/2024 1026   BILITOT 0.3 01/26/2024 1026      Component Value Date/Time   TSH 3.67 06/12/2023 0000   TSH 3.080 09/02/2022 0844   Nutritional Lab Results  Component Value Date   VD25OH 38.3 01/26/2024   VD25OH 57.7 09/02/2022     Assessment and Plan Assessment & Plan Morbid obesity with polyphagia and elevated BMI Obesity management is ongoing with a focus on dietary adherence and exercise. She has been prescribed the category two eating plan but has only  adhered to it 25% of the time. She is not consuming enough fruits, vegetables, or protein, and is skipping meals and not hydrating adequately. She walks for exercise 20 to 30 minutes once a week. She has gained three pounds in the last month. Currently on Wegovy  1.7 mg for polyphagia and requests a refill. Discussed increasing the Wegovy  dose to 2.4 mg, the highest dose, to improve efficacy. Emphasized the importance of dietary adherence alongside medication. - Increased Wegovy  dose to 2.4 mg. - Refilled Wegovy  prescription. - Encouraged adherence to dietary recommendations and exercise regimen.  Gastroesophageal reflux disease (GERD) GERD is managed with Protonix 40 mg daily. She reports ongoing symptoms, particularly in the morning, which she attributes to coffee consumption. Discussed the option of taking Protonix in the morning if nighttime dosing is missed, as it remains effective even after meals. - Continue Protonix 40 mg daily. - Advised taking Protonix in the morning if nighttime dosing is missed.  Vitamin D  deficiency Vitamin D  levels have decreased to 38, which is below the ideal range of 50-60. She is not currently on any vitamin D  supplementation. Discussed the benefits of prescription vitamin D , which is stronger and may be better absorbed. - Prescribed ergocalciferol  50,000 IU weekly.  Prediabetes with insulin  resistance A1c has increased slightly but remains in the 5s, which is an improvement from previous levels. Insulin  levels are better than previous measurements. Discussed monitoring A1c levels in the spring to assess progress. - Will recheck A1c in March.    Catherine [name] was counseled on the importance of maintaining healthy lifestyle habits, including balanced nutrition, regular physical activity, and behavioral modifications, while taking antiobesity medication.  Patient verbalized understanding that medication is an adjunct to, not a replacement for, lifestyle changes and that the  long-term success and weight maintenance depend on continued adherence to these strategies.   Catherine [name] was informed of the importance of frequent follow up visits to maximize her success with intensive lifestyle modifications for her obesity and obesity related health conditions as recommended by USPSTF and CMS guidelines   Louann Penton, MD

## 2024-03-16 ENCOUNTER — Encounter (INDEPENDENT_AMBULATORY_CARE_PROVIDER_SITE_OTHER): Payer: Self-pay | Admitting: Family Medicine

## 2024-03-16 ENCOUNTER — Ambulatory Visit (INDEPENDENT_AMBULATORY_CARE_PROVIDER_SITE_OTHER): Payer: Self-pay | Admitting: Family Medicine

## 2024-03-16 VITALS — BP 116/79 | HR 84 | Temp 98.0°F | Ht 61.0 in | Wt 198.0 lb

## 2024-03-16 DIAGNOSIS — E559 Vitamin D deficiency, unspecified: Secondary | ICD-10-CM | POA: Diagnosis not present

## 2024-03-16 DIAGNOSIS — R7303 Prediabetes: Secondary | ICD-10-CM

## 2024-03-16 DIAGNOSIS — Z6837 Body mass index (BMI) 37.0-37.9, adult: Secondary | ICD-10-CM

## 2024-03-16 DIAGNOSIS — E669 Obesity, unspecified: Secondary | ICD-10-CM | POA: Diagnosis not present

## 2024-03-16 DIAGNOSIS — G4733 Obstructive sleep apnea (adult) (pediatric): Secondary | ICD-10-CM | POA: Diagnosis not present

## 2024-03-16 MED ORDER — VITAMIN D (ERGOCALCIFEROL) 1.25 MG (50000 UNIT) PO CAPS: 50000.0000 [IU] | ORAL_CAPSULE | ORAL | 0 refills | Status: DC

## 2024-03-16 MED ORDER — WEGOVY 2.4 MG/0.75ML ~~LOC~~ SOAJ: 2.4000 mg | Freq: Every day | SUBCUTANEOUS | 0 refills | Status: DC

## 2024-03-16 NOTE — Progress Notes (Signed)
 Office: [phone]  /  Fax: [phone]  WEIGHT SUMMARY AND BIOMETRICS  Anthropometric Measurements Height: 5' 1 (1.549 m) Weight: 198 lb (89.8 kg) BMI (Calculated): 37.43 Weight at Last Visit: 200 lb Weight Lost Since Last Visit: 2 lb Weight Gained Since Last Visit: 0 Starting Weight: 226 lb Total Weight Loss (lbs): 28 lb (12.7 kg) Peak Weight: 242 lb   Body Composition  Body Fat %: 46.1 % Fat Mass (lbs): 91.4 lbs Muscle Mass (lbs): 101.6 lbs Total Body Water (lbs): 73.8 lbs Visceral Fat Rating : 14   Other Clinical Data Fasting: yes Labs: no Today's Visit #: 21 Starting Date: 09/02/22    Chief Complaint: OBESITY   History of Present Illness Catherine [name] is a 63 year old female with obesity and obstructive sleep apnea who presents for obesity treatment and progress assessment.  She has been adhering to the category two eating plan approximately 25% of the time over Thanksgiving and engages in walking for exercise for 20 minutes once a week. She has experienced a weight loss of two pounds over the past three weeks since her last visit. She is currently taking Wegovy , 2.4 mg, for weight loss and is due for a refill. No gastrointestinal issues have been reported while on Wegovy .  In addition to obesity, she is being treated for vitamin D  deficiency with prescription ergocalciferol  and requests a refill. Her labs from October indicated a slightly low vitamin D  level.  She has a diagnosis of obstructive sleep apnea and uses CPAP therapy. Her sleep study from last year revealed an AHI of 32.3 per hour with an O2 nadir of 71% and significant time below 88% oxygen saturation. She is focusing on weight loss to help improve her sleep apnea.  She experiences knee discomfort, particularly when rising after sitting for extended periods, which she partially attributes to colder weather. Prolonged sitting also causes discomfort in her back and neck, which improves with proper  posture.  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 She has social gatherings planned, including a birthday party for her granddaughter and a family gathering on Christmas Day.      PHYSICAL EXAM:  Blood pressure 116/79, pulse 84, temperature 98 F (36.7 C), height 5' 1 (1.549 m), weight 198 lb (89.8 kg), last menstrual period 04/02/2018, SpO2 96%. Body mass index is 37.41 kg/m.  DIAGNOSTIC DATA REVIEWED:  BMET    Component Value Date/Time   NA 142 01/26/2024 1026   K 4.2 01/26/2024 1026   CL 104 01/26/2024 1026   CO2 23 01/26/2024 1026   GLUCOSE 86 01/26/2024 1026   BUN 15 01/26/2024 1026   CREATININE 0.65 01/26/2024 1026   CALCIUM 9.5 01/26/2024 1026   Lab Results  Component Value Date   HGBA1C 5.7 (H) 01/26/2024   HGBA1C 6.1 (H) 09/02/2022   Lab Results  Component Value Date   INSULIN  12.4 01/26/2024   INSULIN  28.7 (H) 09/02/2022   Lab Results  Component Value Date   TSH 3.67 06/12/2023   CBC    Component Value Date/Time   WBC 10.1 06/12/2023 0000   WBC 9.5 09/23/2022 0908   RBC 5.1 06/12/2023 0000   HGB 13.5 06/12/2023 0000   HGB 13.2 09/02/2022 0844   HCT 41 06/12/2023 0000   HCT 42.0 09/02/2022 0844   PLT 240 06/12/2023 0000   PLT 297 09/02/2022 0844   MCV 83.5 09/23/2022 0908   MCV 84  09/02/2022 0844   MCH 26.4 09/23/2022 0908   MCHC 31.6 09/23/2022 0908   RDW 15.2 09/23/2022 0908   RDW 15.0 09/02/2022 0844   Iron Studies No results found for: IRON, TIBC, FERRITIN, IRONPCTSAT Lipid Panel     Component Value Date/Time   CHOL 145 01/26/2024 1026   TRIG 67 01/26/2024 1026   HDL 53 01/26/2024 1026   LDLCALC 79 01/26/2024 1026   Hepatic Function Panel     Component Value Date/Time   PROT 6.6 01/26/2024 1026   ALBUMIN 4.3 01/26/2024 1026   AST 15 01/26/2024 1026   ALT 9 01/26/2024 1026   ALKPHOS 107 01/26/2024 1026   BILITOT 0.3 01/26/2024 1026      Component  Value Date/Time   TSH 3.67 06/12/2023 0000   TSH 3.080 09/02/2022 0844   Nutritional Lab Results  Component Value Date   VD25OH 38.3 01/26/2024   VD25OH 57.7 09/02/2022     Assessment and Plan Assessment & Plan Obesity Managed with Wegovy  2.4 mg. She has lost 2 pounds over the last three weeks. Adherence to the category two eating plan is 25%. Exercise includes walking for 20 minutes once a week. Discussed potential switch to Zepbound  if insurance issues arise with Wegovy , especially considering its coverage for sleep apnea. Strategies for managing weight during the holidays include prioritizing protein intake and choosing water over sweet tea. - Continue Wegovy  2.4 mg - Refilled Wegovy  prescription - Discussed potential switch to Zepbound  if insurance issues arise - Encouraged adherence to category two eating plan  Vitamin D  deficiency Managed with prescription ergocalciferol . Recent labs in October showed low vitamin D  levels. - Continue ergocalciferol  - Refilled ergocalciferol  prescription  Obstructive sleep apnea Managed with CPAP. Previous sleep study showed AHI of 32.3 per hour with significant oxygen desaturation. Discussed potential use of Zepbound  for sleep apnea if insurance coverage issues arise with Wegovy . - Continue CPAP therapy - Discussed potential use of Zepbound  for sleep apnea if insurance coverage issues arise - Continue diet, exercise and weight loss as discussed today as an important part of the treatment plan   Prediabetes A1c of 5.7%. Plan to monitor A1c levels with recheck in February or March. - Will recheck A1c in February or March - Continue diet, exercise and weight loss as discussed today as an important part of the treatment plan       Patients who are on anti-obesity medications are counseled on the importance of maintaining healthy lifestyle habits, including balanced nutrition, regular physical activity, and behavioral modifications,   Medication is an adjunct to, not a replacement for, lifestyle changes and that the long-term success and weight maintenance depend on continued adherence to these strategies.   Tyla was informed of the importance of frequent follow up visits to maximize her success with intensive lifestyle modifications for her obesity and obesity related health conditions as recommended by USPSTF and CMS guidelines  Louann Penton, MD

## 2024-03-17 ENCOUNTER — Other Ambulatory Visit (INDEPENDENT_AMBULATORY_CARE_PROVIDER_SITE_OTHER): Payer: Self-pay | Admitting: Family Medicine

## 2024-03-21 ENCOUNTER — Encounter (INDEPENDENT_AMBULATORY_CARE_PROVIDER_SITE_OTHER): Payer: Self-pay | Admitting: Family Medicine

## 2024-03-22 ENCOUNTER — Other Ambulatory Visit (INDEPENDENT_AMBULATORY_CARE_PROVIDER_SITE_OTHER): Payer: Self-pay

## 2024-03-22 DIAGNOSIS — Z6837 Body mass index (BMI) 37.0-37.9, adult: Secondary | ICD-10-CM

## 2024-03-22 MED ORDER — WEGOVY 2.4 MG/0.75ML ~~LOC~~ SOAJ: SUBCUTANEOUS | 0 refills | Status: DC

## 2024-03-22 MED ORDER — WEGOVY 2.4 MG/0.75ML ~~LOC~~ SOAJ: 2.4000 mg | Freq: Every day | SUBCUTANEOUS | 0 refills | Status: DC

## 2024-04-20 ENCOUNTER — Ambulatory Visit (INDEPENDENT_AMBULATORY_CARE_PROVIDER_SITE_OTHER): Admitting: Family Medicine

## 2024-04-20 ENCOUNTER — Encounter (INDEPENDENT_AMBULATORY_CARE_PROVIDER_SITE_OTHER): Payer: Self-pay | Admitting: Family Medicine

## 2024-04-20 VITALS — BP 129/82 | HR 100 | Temp 98.0°F | Ht 61.0 in | Wt 200.0 lb

## 2024-04-20 DIAGNOSIS — E559 Vitamin D deficiency, unspecified: Secondary | ICD-10-CM | POA: Diagnosis not present

## 2024-04-20 DIAGNOSIS — Z6837 Body mass index (BMI) 37.0-37.9, adult: Secondary | ICD-10-CM | POA: Diagnosis not present

## 2024-04-20 MED ORDER — VITAMIN D (ERGOCALCIFEROL) 1.25 MG (50000 UNIT) PO CAPS: 50000.0000 [IU] | ORAL_CAPSULE | ORAL | 0 refills | Status: AC

## 2024-04-20 MED ORDER — WEGOVY 2.4 MG/0.75ML ~~LOC~~ SOAJ: SUBCUTANEOUS | 0 refills | Status: AC

## 2024-04-20 NOTE — Progress Notes (Signed)
 "  Office: [phone]  /  Fax: [phone]  WEIGHT SUMMARY AND BIOMETRICS  Anthropometric Measurements Height: 5' 1 (1.549 m) Weight: 200 lb (90.7 kg) BMI (Calculated): 37.81 Weight at Last Visit: 198 lb Weight Lost Since Last Visit: 0 Weight Gained Since Last Visit: 2 lb Starting Weight: 226 lb Total Weight Loss (lbs): 26 lb (11.8 kg) Peak Weight: 242 lb   Body Composition  Body Fat %: 46.8 % Fat Mass (lbs): 93.6 lbs Muscle Mass (lbs): 101 lbs Total Body Water (lbs): 73.6 lbs Visceral Fat Rating : 15   Other Clinical Data Fasting: yes Labs: no Today's Visit #: 22 Starting Date: 09/02/22    Chief Complaint: OBESITY    History of Present Illness Catherine [name] is a 64 year old female who presents for a follow-up on her obesity treatment plan and progress assessment.  She has been prescribed a category two eating plan but has struggled to adhere to it over the holidays, resulting in a weight gain of two pounds over the last month. She is not currently exercising but is working on getting back on track.  She might have to discontinue Wegovy  due to insurance coverage issues, as it is not being covered and the out-of-pocket cost is significant. She is exploring options for the oral form of Wegovy , which has recently become available, and is considering the cost implications of both the injection and the pill form. She was due to take her Wegovy  injection today.  She experiences sinus congestion and headache. Her head hurts and she feels sinus pressure, but no severe symptoms like a bad cough or difficulty breathing were reported.  She discusses her meal planning and preparation challenges, noting that she often resorts to less healthy options like 'a sleeve of crackers' when not prepared. She is interested in meal prep strategies that align with her dietary goals, focusing on calorie and protein intake.      PHYSICAL EXAM:  Blood pressure 129/82, pulse 100,  temperature 98 F (36.7 C), height 5' 1 (1.549 m), weight 200 lb (90.7 kg), last menstrual period 04/02/2018, SpO2 94%. Body mass index is 37.79 kg/m.  DIAGNOSTIC DATA REVIEWED BY MYSELF TODAY:  BMET    Component Value Date/Time   NA 142 01/26/2024 1026   K 4.2 01/26/2024 1026   CL 104 01/26/2024 1026   CO2 23 01/26/2024 1026   GLUCOSE 86 01/26/2024 1026   BUN 15 01/26/2024 1026   CREATININE 0.65 01/26/2024 1026   CALCIUM 9.5 01/26/2024 1026   Lab Results  Component Value Date   HGBA1C 5.7 (H) 01/26/2024   HGBA1C 6.1 (H) 09/02/2022   Lab Results  Component Value Date   INSULIN  12.4 01/26/2024   INSULIN  28.7 (H) 09/02/2022   Lab Results  Component Value Date   TSH 3.67 06/12/2023   CBC    Component Value Date/Time   WBC 10.1 06/12/2023 0000   WBC 9.5 09/23/2022 0908   RBC 5.1 06/12/2023 0000   HGB 13.5 06/12/2023 0000   HGB 13.2 09/02/2022 0844   HCT 41 06/12/2023 0000   HCT 42.0 09/02/2022 0844   PLT 240 06/12/2023 0000   PLT 297 09/02/2022 0844   MCV 83.5 09/23/2022 0908   MCV 84 09/02/2022 0844   MCH 26.4 09/23/2022 0908   MCHC 31.6 09/23/2022 0908   RDW 15.2 09/23/2022 0908   RDW 15.0 09/02/2022 0844   Iron Studies No results found for: IRON, TIBC, FERRITIN, IRONPCTSAT Lipid Panel  Component Value Date/Time   CHOL 145 01/26/2024 1026   TRIG 67 01/26/2024 1026   HDL 53 01/26/2024 1026   LDLCALC 79 01/26/2024 1026   Hepatic Function Panel     Component Value Date/Time   PROT 6.6 01/26/2024 1026   ALBUMIN 4.3 01/26/2024 1026   AST 15 01/26/2024 1026   ALT 9 01/26/2024 1026   ALKPHOS 107 01/26/2024 1026   BILITOT 0.3 01/26/2024 1026      Component Value Date/Time   TSH 3.67 06/12/2023 0000   TSH 3.080 09/02/2022 0844   Nutritional Lab Results  Component Value Date   VD25OH 38.3 01/26/2024   VD25OH 57.7 09/02/2022     Assessment and Plan Assessment & Plan Morbid obesity Recent weight gain of 2 pounds over the last month  due to holiday indulgence. Currently not exercising and has difficulty adhering to the category two eating plan. Insurance issues with Wegovy  coverage, but potential cost reduction with oral Wegovy  and subsidy options discussed. Transition to oral Wegovy  considered due to insurance constraints and cost considerations. Oral Wegovy  requires administration on an empty stomach with a 30-minute interval before eating to ensure proper absorption and minimize nausea. - Prescribed Wegovy  pen for continuation of current dose. - Discussed potential switch to oral Wegovy  with subsidy options. - Provided meal prep recipes to support dietary goals. - Encouraged adherence to category two eating plan with focus on protein intake.  Vitamin D  deficiency Requiring ongoing supplementation, stable. - Refilled vitamin D  prescription. - Will check labs in one month at follow-up visit.      Patients who are on anti-obesity medications are counseled on the importance of maintaining healthy lifestyle habits, including balanced nutrition, regular physical activity, and behavioral modifications,  Medication is an adjunct to, not a replacement for, lifestyle changes and that the long-term success and weight maintenance depend on continued adherence to these strategies.   Catherine [name] was informed of the importance of frequent follow up visits to maximize her success with intensive lifestyle modifications for her obesity and obesity related health conditions as recommended by USPSTF and CMS guidelines  Catherine Penton, MD   "

## 2024-05-18 ENCOUNTER — Ambulatory Visit (INDEPENDENT_AMBULATORY_CARE_PROVIDER_SITE_OTHER): Admitting: Family Medicine

## 2024-06-15 ENCOUNTER — Ambulatory Visit (INDEPENDENT_AMBULATORY_CARE_PROVIDER_SITE_OTHER): Admitting: Family Medicine

## 2024-10-18 ENCOUNTER — Ambulatory Visit: Admitting: Adult Health

## 2024-11-25 ENCOUNTER — Ambulatory Visit (HOSPITAL_BASED_OUTPATIENT_CLINIC_OR_DEPARTMENT_OTHER): Admitting: Obstetrics & Gynecology
